# Patient Record
Sex: Female | Born: 1960 | Race: White | Hispanic: No | Marital: Married | Smoking: Never smoker
Health system: Southern US, Community
[De-identification: ages and names within clinical notes are randomized; demographics above are authoritative.]

## PROBLEM LIST (undated history)

## (undated) DIAGNOSIS — N879 Dysplasia of cervix uteri, unspecified: Secondary | ICD-10-CM

## (undated) DIAGNOSIS — I10 Essential (primary) hypertension: Secondary | ICD-10-CM

## (undated) DIAGNOSIS — E7800 Pure hypercholesterolemia, unspecified: Secondary | ICD-10-CM

## (undated) DIAGNOSIS — F419 Anxiety disorder, unspecified: Secondary | ICD-10-CM

## (undated) DIAGNOSIS — K219 Gastro-esophageal reflux disease without esophagitis: Secondary | ICD-10-CM

## (undated) DIAGNOSIS — Z9189 Other specified personal risk factors, not elsewhere classified: Secondary | ICD-10-CM

## (undated) HISTORY — DX: Essential (primary) hypertension: I10

## (undated) HISTORY — DX: Anxiety disorder, unspecified: F41.9

## (undated) HISTORY — DX: Pure hypercholesterolemia, unspecified: E78.00

## (undated) HISTORY — PX: CARPAL TUNNEL RELEASE: SHX101

## (undated) HISTORY — DX: Dysplasia of cervix uteri, unspecified: N87.9

## (undated) HISTORY — PX: FOOT SURGERY: SHX648

## (undated) HISTORY — DX: Other specified personal risk factors, not elsewhere classified: Z91.89

## (undated) HISTORY — DX: Gastro-esophageal reflux disease without esophagitis: K21.9

---

## 1991-12-06 DIAGNOSIS — N879 Dysplasia of cervix uteri, unspecified: Secondary | ICD-10-CM

## 1991-12-06 HISTORY — DX: Dysplasia of cervix uteri, unspecified: N87.9

## 2001-01-01 ENCOUNTER — Other Ambulatory Visit: Admission: RE | Admit: 2001-01-01 | Discharge: 2001-01-01 | Payer: Self-pay | Admitting: Gynecology

## 2002-01-21 ENCOUNTER — Other Ambulatory Visit: Admission: RE | Admit: 2002-01-21 | Discharge: 2002-01-21 | Payer: Self-pay | Admitting: Gynecology

## 2003-01-23 ENCOUNTER — Other Ambulatory Visit: Admission: RE | Admit: 2003-01-23 | Discharge: 2003-01-23 | Payer: Self-pay | Admitting: Gynecology

## 2004-02-09 ENCOUNTER — Other Ambulatory Visit: Admission: RE | Admit: 2004-02-09 | Discharge: 2004-02-09 | Payer: Self-pay | Admitting: Gynecology

## 2004-02-12 ENCOUNTER — Encounter: Admission: RE | Admit: 2004-02-12 | Discharge: 2004-02-12 | Payer: Self-pay | Admitting: Gynecology

## 2005-02-10 ENCOUNTER — Other Ambulatory Visit: Admission: RE | Admit: 2005-02-10 | Discharge: 2005-02-10 | Payer: Self-pay | Admitting: Gynecology

## 2006-03-31 ENCOUNTER — Other Ambulatory Visit: Admission: RE | Admit: 2006-03-31 | Discharge: 2006-03-31 | Payer: Self-pay | Admitting: Gynecology

## 2006-04-28 ENCOUNTER — Encounter: Admission: RE | Admit: 2006-04-28 | Discharge: 2006-04-28 | Payer: Self-pay | Admitting: Gynecology

## 2006-11-24 ENCOUNTER — Ambulatory Visit (HOSPITAL_COMMUNITY): Admission: RE | Admit: 2006-11-24 | Discharge: 2006-11-24 | Payer: Self-pay | Admitting: Family Medicine

## 2007-04-06 ENCOUNTER — Other Ambulatory Visit: Admission: RE | Admit: 2007-04-06 | Discharge: 2007-04-06 | Payer: Self-pay | Admitting: Gynecology

## 2008-05-15 ENCOUNTER — Other Ambulatory Visit: Admission: RE | Admit: 2008-05-15 | Discharge: 2008-05-15 | Payer: Self-pay | Admitting: Gynecology

## 2009-04-17 ENCOUNTER — Encounter: Admission: RE | Admit: 2009-04-17 | Discharge: 2009-04-17 | Payer: Self-pay | Admitting: Gynecology

## 2009-07-22 ENCOUNTER — Ambulatory Visit: Payer: Self-pay | Admitting: Gynecology

## 2009-07-22 ENCOUNTER — Encounter: Payer: Self-pay | Admitting: Gynecology

## 2009-07-22 ENCOUNTER — Other Ambulatory Visit: Admission: RE | Admit: 2009-07-22 | Discharge: 2009-07-22 | Payer: Self-pay | Admitting: Gynecology

## 2009-10-12 ENCOUNTER — Encounter: Admission: RE | Admit: 2009-10-12 | Discharge: 2009-10-12 | Payer: Self-pay | Admitting: Gynecology

## 2010-08-20 ENCOUNTER — Ambulatory Visit: Payer: Self-pay | Admitting: Gynecology

## 2010-08-20 ENCOUNTER — Other Ambulatory Visit: Admission: RE | Admit: 2010-08-20 | Discharge: 2010-08-20 | Payer: Self-pay | Admitting: Gynecology

## 2010-08-21 IMAGING — MG MM DIAGNOSTIC UNILATERAL R
3 series · 3 of 3 positions shown · non-contrast
Comparison: 04/03/2009 mammograms from the [REDACTED],
[facility] and 04/28/2006 mammograms from the [REDACTED].

CLINICAL DATA: Abnormal screening mammogram with right breast
calcifications.

DIGITAL DIAGNOSTIC RIGHT MAMMOGRAM

[R CC]
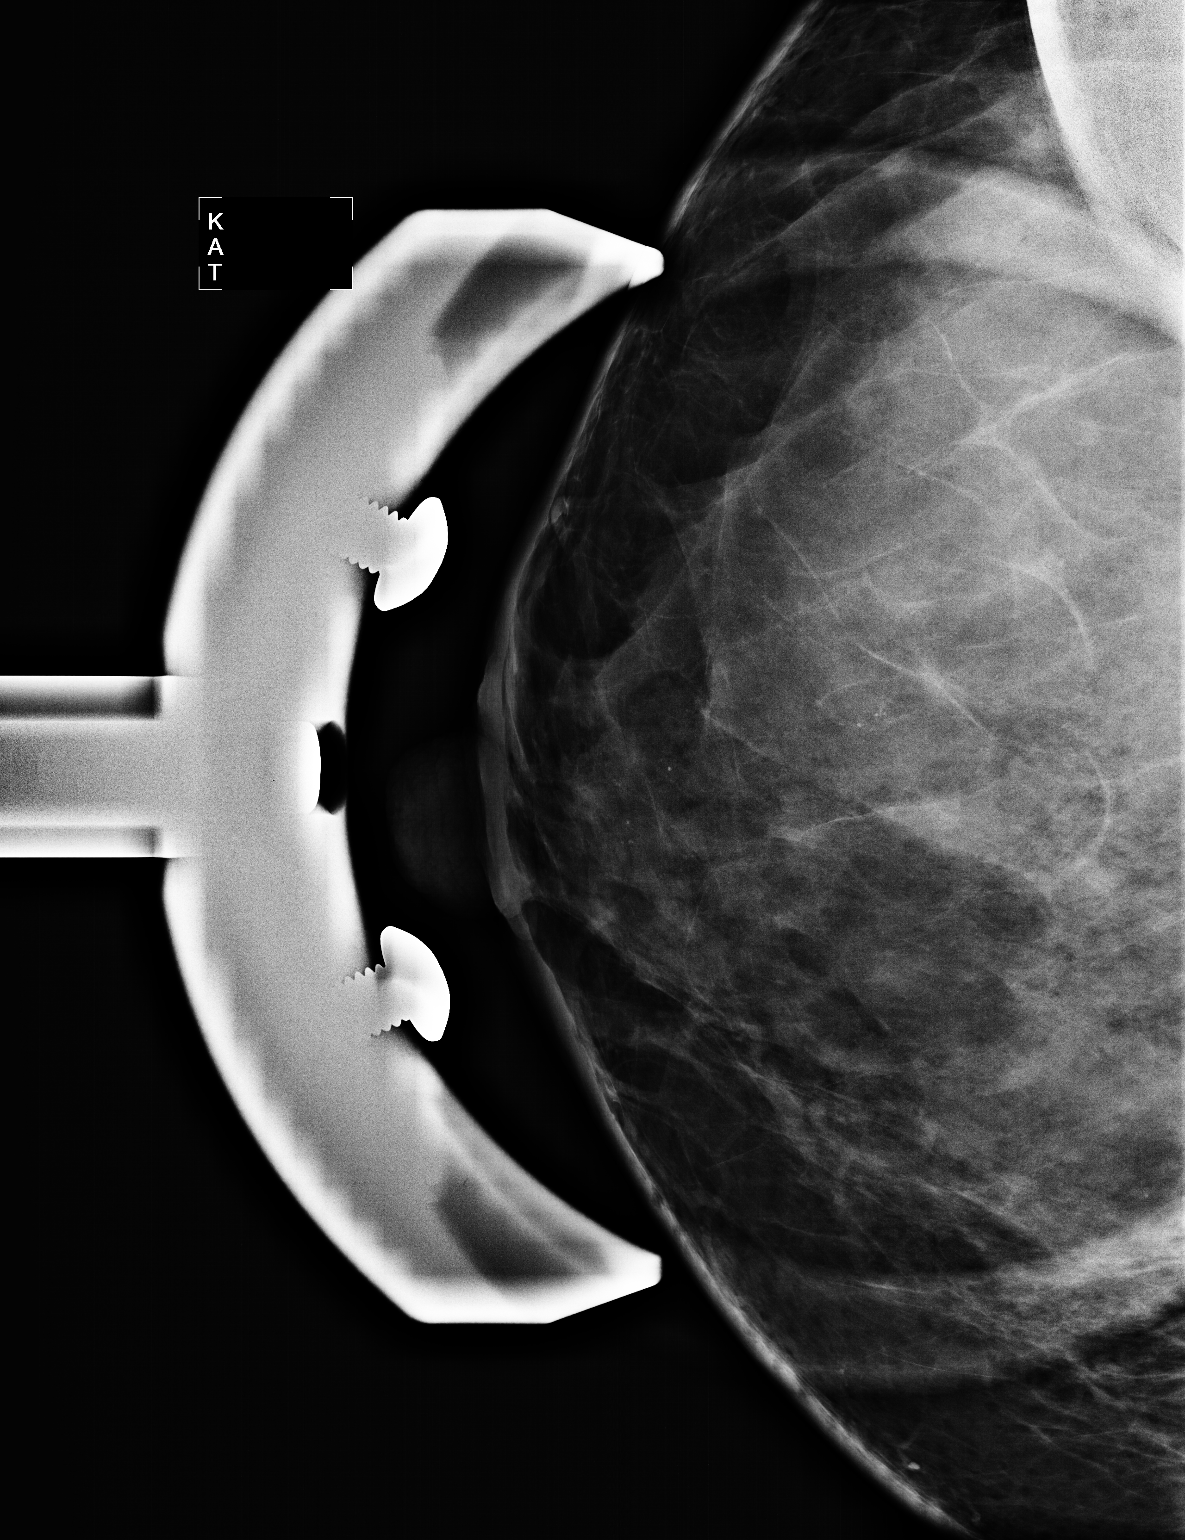

[R ML (1 of 2)]
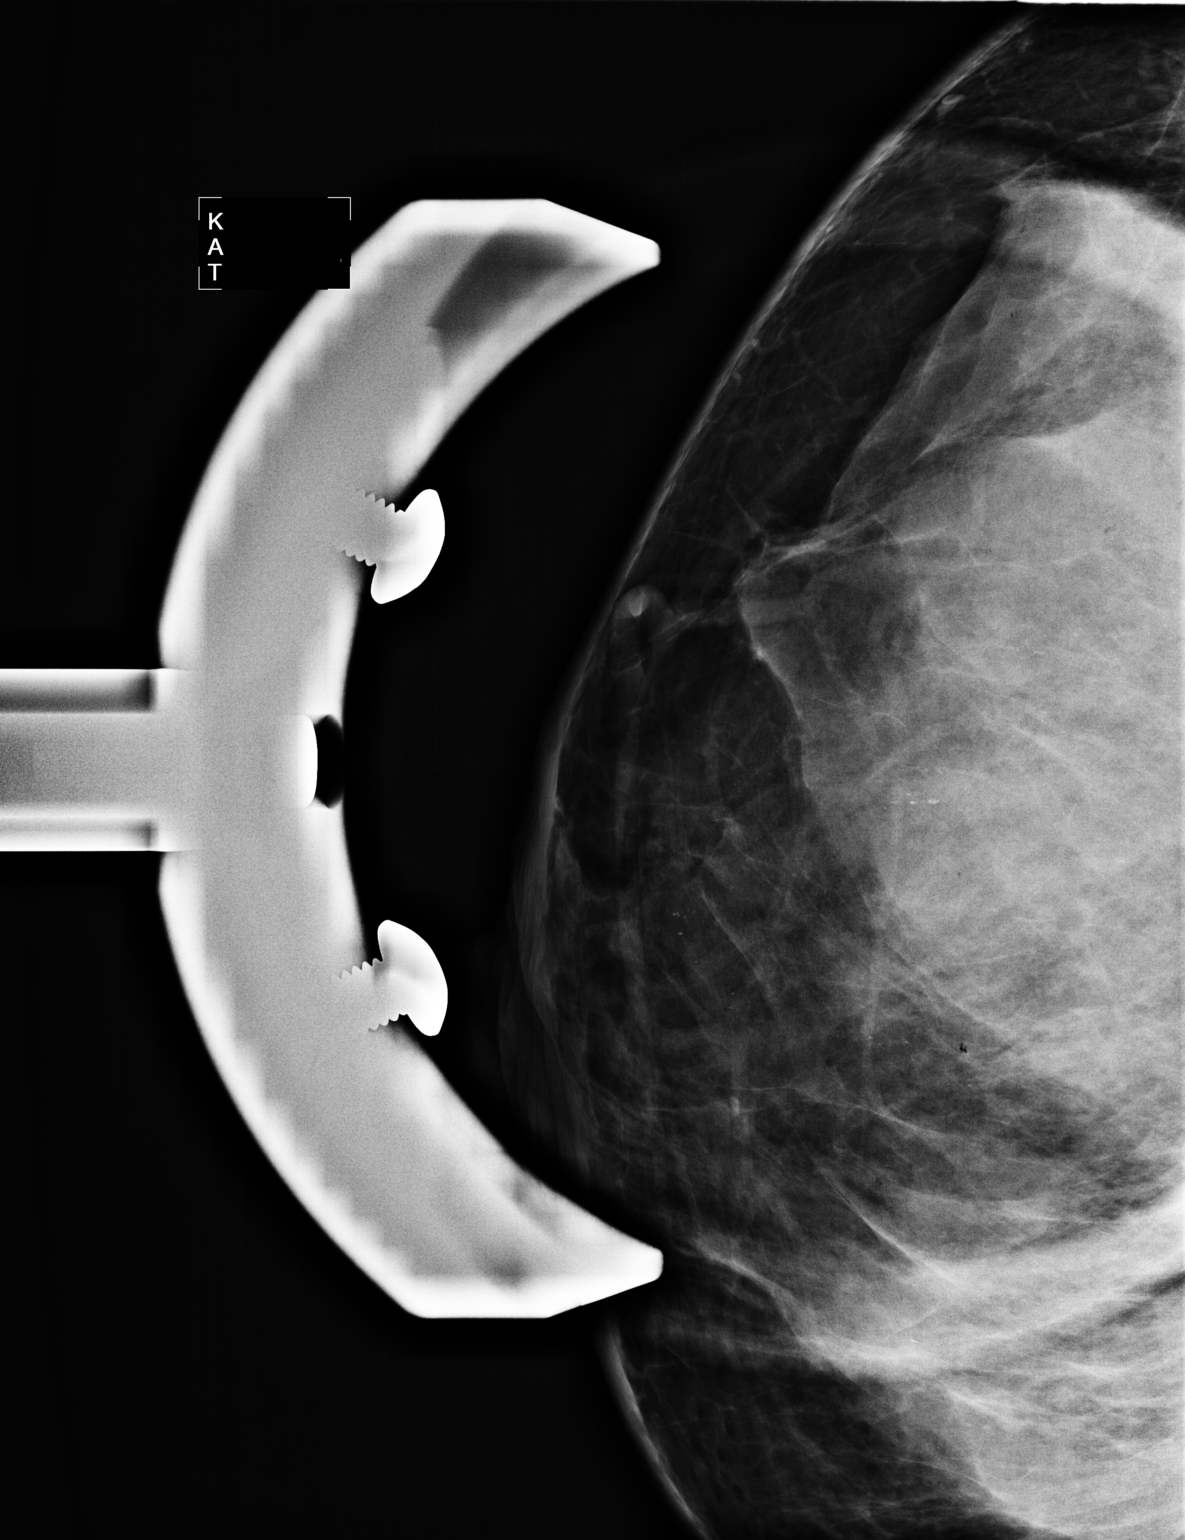

[R ML (2 of 2)]
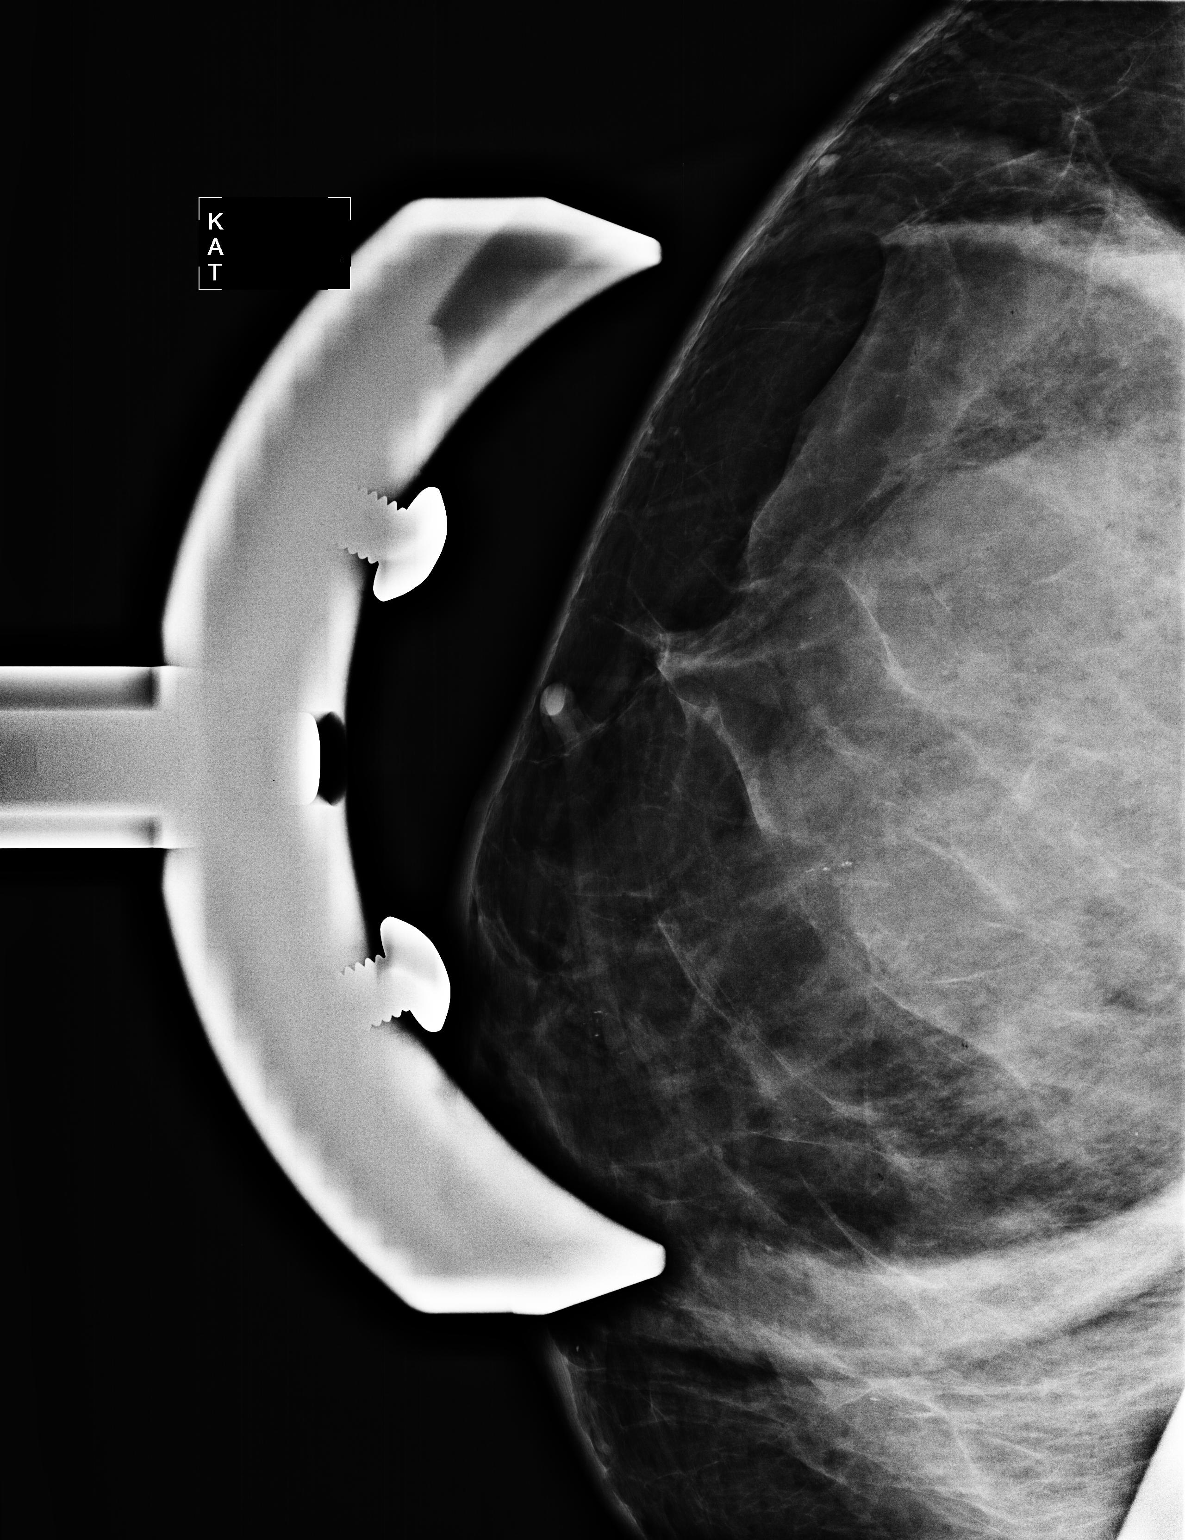

[3 of 3 positions shown; findings below may reference images not displayed]

FINDINGS: CC and MLO magnification views of the anterior and
central right breast demonstrate a few punctate and slightly coarse
calcifications within the subareolar region and anterior central
right breast.  These calcifications were present in 9333 and have
not significantly changed.

There is no evidence of associated mass or distortion.
IMPRESSION: Likely benign right breast calcifications.  Right diagnostic
mammogram in 6 months is recommended to ensure stability.

These findings were discussed with the patient.  She was encouraged
to continue monthly self exams and to contact her primary physician
if any changes noted.

BI-RADS CATEGORY 3:  Probably benign finding(s) - short interval
follow-up suggested.

Recommend right diagnostic mammogram with magnification views in 6
months to ensure stability of these right breast calcifications.

## 2010-10-05 ENCOUNTER — Encounter: Admission: RE | Admit: 2010-10-05 | Discharge: 2010-10-05 | Payer: Self-pay | Admitting: Gynecology

## 2010-12-26 ENCOUNTER — Encounter: Payer: Self-pay | Admitting: Gynecology

## 2011-09-27 ENCOUNTER — Other Ambulatory Visit: Payer: Self-pay | Admitting: Gynecology

## 2011-09-27 ENCOUNTER — Other Ambulatory Visit: Payer: Self-pay | Admitting: *Deleted

## 2011-09-27 DIAGNOSIS — R921 Mammographic calcification found on diagnostic imaging of breast: Secondary | ICD-10-CM

## 2011-11-18 ENCOUNTER — Other Ambulatory Visit: Payer: Self-pay | Admitting: Gynecology

## 2011-11-18 ENCOUNTER — Ambulatory Visit
Admission: RE | Admit: 2011-11-18 | Discharge: 2011-11-18 | Disposition: A | Discharge status: Home / Self Care | Payer: 59 | Source: Ambulatory Visit | Attending: Gynecology | Admitting: Gynecology

## 2011-11-18 DIAGNOSIS — R921 Mammographic calcification found on diagnostic imaging of breast: Secondary | ICD-10-CM

## 2011-11-21 DIAGNOSIS — E7800 Pure hypercholesterolemia, unspecified: Secondary | ICD-10-CM | POA: Insufficient documentation

## 2011-11-21 DIAGNOSIS — IMO0001 Reserved for inherently not codable concepts without codable children: Secondary | ICD-10-CM | POA: Insufficient documentation

## 2011-11-30 ENCOUNTER — Ambulatory Visit (INDEPENDENT_AMBULATORY_CARE_PROVIDER_SITE_OTHER): Payer: 59 | Admitting: Gynecology

## 2011-11-30 ENCOUNTER — Encounter: Payer: Self-pay | Admitting: Gynecology

## 2011-11-30 VITALS — BP 118/70 | Ht 64.0 in | Wt 164.0 lb

## 2011-11-30 DIAGNOSIS — K219 Gastro-esophageal reflux disease without esophagitis: Secondary | ICD-10-CM | POA: Insufficient documentation

## 2011-11-30 DIAGNOSIS — Z30431 Encounter for routine checking of intrauterine contraceptive device: Secondary | ICD-10-CM

## 2011-11-30 DIAGNOSIS — Z01419 Encounter for gynecological examination (general) (routine) without abnormal findings: Secondary | ICD-10-CM

## 2011-11-30 NOTE — Progress Notes (Signed)
Danielle Estrada [birth year] [id number]        50 y.o.  for annual exam.  Recent mammogram showed calcifications and she has a needle biopsy schedule for this coming week. Having light spotting menses each month. Not having hot flashes sweats or other symptoms.  Past medical history,surgical history, medications, allergies, family history and social history were all reviewed and documented in the EPIC chart. ROS:  Was performed and pertinent positives and negatives are included in the history.  Exam: chaperone present Filed Vitals:   11/30/11 1101  BP: 118/70   General appearance  Normal Skin grossly normal Head/Neck normal with no cervical or supraclavicular adenopathy thyroid normal Lungs  clear Cardiac RR, without RMG Abdominal  soft, nontender, without masses, organomegaly or hernia Breasts  examined lying and sitting without masses, retractions, discharge or axillary adenopathy. Pelvic  Ext/BUS/vagina  normal   Cervix  normal  IUD string not visualized or palpated on bimanual  Uterus  retroverted, normal size, shape and contour, midline and mobile nontender   Adnexa  Without masses or tenderness    Anus and perineum  normal   Rectovaginal  normal sphincter tone without palpated masses or tenderness.    Assessment/Plan:  50 y.o. female for annual exam.    1. Breast health. Patient has a biopsy scheduled for next week. I reviewed with her that if it is carcinoma especially if receptor positive but regardless that I would recommend having her Mirena IUD removed given that it is progesterone-related. Patient will follow up with me after biopsy and if indeed carcinoma then we'll plan on removing her IUD.  SBE monthly reviewed. 2. IUD management. She has another one and a half years left on her IUD. When it comes time to have it removed assuming #1 not cancer then we'll plan FSH first before considering other birth control or placement of the IUD. 3. Pap smear. Last Pap 2011 which was  normal. She has numerous normal records in her chart and no history of significant abnormalities in the past. No Was done today and we'll plan on every three-year interval she agrees with this.  4. Health maintenance.  No blood work was done today as is all done through her primary who follows her for health maintenance and she just recently had it all done.    Dara Lords MD, 11:44 AM 11/30/2011

## 2011-11-30 NOTE — Patient Instructions (Signed)
Follow up for breast biopsy as scheduled. If positive for carcinoma then we'll need to have IUD removed and schedule an appointment for this.

## 2011-12-05 ENCOUNTER — Ambulatory Visit
Admission: RE | Admit: 2011-12-05 | Discharge: 2011-12-05 | Disposition: A | Discharge status: Home / Self Care | Payer: 59 | Source: Ambulatory Visit | Attending: Gynecology | Admitting: Gynecology

## 2011-12-05 DIAGNOSIS — R921 Mammographic calcification found on diagnostic imaging of breast: Secondary | ICD-10-CM

## 2011-12-05 HISTORY — PX: BREAST BIOPSY: SHX20

## 2012-12-10 ENCOUNTER — Other Ambulatory Visit: Payer: Self-pay | Admitting: Gynecology

## 2012-12-10 DIAGNOSIS — Z1231 Encounter for screening mammogram for malignant neoplasm of breast: Secondary | ICD-10-CM

## 2012-12-31 ENCOUNTER — Ambulatory Visit
Admission: RE | Admit: 2012-12-31 | Discharge: 2012-12-31 | Disposition: A | Discharge status: Home / Self Care | Payer: 59 | Source: Ambulatory Visit | Attending: Gynecology | Admitting: Gynecology

## 2012-12-31 DIAGNOSIS — Z1231 Encounter for screening mammogram for malignant neoplasm of breast: Secondary | ICD-10-CM

## 2013-01-15 ENCOUNTER — Ambulatory Visit (INDEPENDENT_AMBULATORY_CARE_PROVIDER_SITE_OTHER): Payer: 59 | Admitting: Gynecology

## 2013-01-15 ENCOUNTER — Encounter: Payer: Self-pay | Admitting: Gynecology

## 2013-01-15 ENCOUNTER — Other Ambulatory Visit (HOSPITAL_COMMUNITY)
Admission: RE | Admit: 2013-01-15 | Discharge: 2013-01-15 | Disposition: A | Discharge status: Home / Self Care | Payer: 59 | Source: Ambulatory Visit | Attending: Gynecology | Admitting: Gynecology

## 2013-01-15 VITALS — BP 130/80 | Ht 64.0 in | Wt 162.0 lb

## 2013-01-15 DIAGNOSIS — Z30431 Encounter for routine checking of intrauterine contraceptive device: Secondary | ICD-10-CM

## 2013-01-15 DIAGNOSIS — Z01419 Encounter for gynecological examination (general) (routine) without abnormal findings: Secondary | ICD-10-CM | POA: Insufficient documentation

## 2013-01-15 DIAGNOSIS — Z1151 Encounter for screening for human papillomavirus (HPV): Secondary | ICD-10-CM | POA: Insufficient documentation

## 2013-01-15 DIAGNOSIS — N879 Dysplasia of cervix uteri, unspecified: Secondary | ICD-10-CM | POA: Insufficient documentation

## 2013-01-15 NOTE — Patient Instructions (Signed)
Follow up for IUD replacement before May 2014. Otherwise follow up in one year for annual gynecologic exam.

## 2013-01-15 NOTE — Progress Notes (Signed)
Danielle Estrada August 12, 1961 [id number]        52 y.o.  [id number] for annual exam.  Several issues noted below.  Past medical history,surgical history, medications, allergies, family history and social history were all reviewed and documented in the EPIC chart. ROS:  Was performed and pertinent positives and negatives are included in the history.  Exam: [name] assistant Filed Vitals:   01/15/13 1605  BP: 130/80  Height: 5\' 4"  (1.626 m)  Weight: 162 lb (73.483 kg)   General appearance  Normal Skin grossly normal Head/Neck normal with no cervical or supraclavicular adenopathy thyroid normal Lungs  clear Cardiac RR, without RMG Abdominal  soft, nontender, without masses, organomegaly or hernia Breasts  examined lying and sitting without masses, retractions, discharge or axillary adenopathy. Pelvic  Ext/BUS/vagina  normal   Cervix  Normal. IUD string palpated at os  Uterus  anteverted, normal size, shape and contour, midline and mobile nontender   Adnexa  Without masses or tenderness    Anus and perineum  normal   Rectovaginal  normal sphincter tone without palpated masses or tenderness.    Assessment/Plan:  52 y.o. [id number] female for annual exam.   1. Mirena IUD 04/2008. Due to be replaced this coming May. Will check FSH now as she is age 51.  Tentatively she knows to come back before May to have her IUD replaced. The options to remove it and leave it out given her age particularly if [facility] is rising discussed but I think replacing it would be good idea as she is having good menstrual suppression with it and this will clearly get her through the menopause if we replace it now.  The issues of contraception if we remove it now and not replace it was also discussed. 2. Mammography 12/2012 normal. Continue with annual mammography. SBE monthly reviewed. 3. Pap smear 2011. Pap/HPV today. Patient does have history of cervical dysplasia in 1993 and had some treatment but she does not remember what. Her  Pap smears have all been normal since then. 4. Colonoscopy. Recommended scheduling screening colonoscopy as she turns 51 she agrees to do so. 5. Health maintenance. No blood work done today as it was all done through her primary physician's office who she recently saw. She does have a mildly elevated glucose of 104 and an LDL of 124. She is initiating a weight loss program which will help with this. Follow up for IUD replacement and FSH value. Otherwise follow up in one year.    Dara Lords MD, 4:44 PM 01/15/2013

## 2013-01-16 ENCOUNTER — Encounter: Payer: Self-pay | Admitting: Gynecology

## 2013-01-16 LAB — FOLLICLE STIMULATING HORMONE: FSH: 48.4 m[IU]/mL

## 2013-01-29 ENCOUNTER — Other Ambulatory Visit: Payer: Self-pay | Admitting: Gynecology

## 2013-01-29 ENCOUNTER — Telehealth: Payer: Self-pay | Admitting: Gynecology

## 2013-01-29 MED ORDER — LEVONORGESTREL 20 MCG/24HR IU IUD: INTRAUTERINE_SYSTEM | Freq: Once | INTRAUTERINE | Status: DC

## 2013-01-29 NOTE — Telephone Encounter (Signed)
Pt was advised today that the removal of existing Mirena and the insertion of a new one is covered by her [employer] ins at 100%, no copay.  Appt already made for 04/02/13.WL

## 2013-03-05 HISTORY — PX: INTRAUTERINE DEVICE INSERTION: SHX323

## 2013-04-02 ENCOUNTER — Encounter: Payer: Self-pay | Admitting: Gynecology

## 2013-04-02 ENCOUNTER — Ambulatory Visit (INDEPENDENT_AMBULATORY_CARE_PROVIDER_SITE_OTHER): Payer: 59 | Admitting: Gynecology

## 2013-04-02 DIAGNOSIS — Z30431 Encounter for routine checking of intrauterine contraceptive device: Secondary | ICD-10-CM

## 2013-04-02 DIAGNOSIS — Z30432 Encounter for removal of intrauterine contraceptive device: Secondary | ICD-10-CM

## 2013-04-02 MED ORDER — LEVONORGESTREL 20 MCG/24HR IU IUD: INTRAUTERINE_SYSTEM | Freq: Once | INTRAUTERINE | Status: DC

## 2013-04-02 NOTE — Patient Instructions (Signed)
Intrauterine Device Insertion Most often, an intrauterine device (IUD) is inserted into the uterus to prevent pregnancy. There are 2 types of IUDs available:  Copper IUD. This type of IUD creates an environment that is not favorable to sperm survival. The mechanism of action of the copper IUD is not known for certain. It can stay in place for 10 years.  Hormone IUD. This type of IUD contains the hormone progestin (synthetic progesterone). The progestin thickens the cervical mucus and prevents sperm from entering the uterus, and it also thins the uterine lining. There is no evidence that the hormone IUD prevents implantation. The hormone IUD can stay in place for up to 5 years. An IUD is the most cost-effective birth control if left in place for the full duration. It may be removed at any time. LET YOUR CAREGIVER KNOW ABOUT:  Sensitivity to metals.  Medicines taken including herbs, eyedrops, over-the-counter medicines, and creams.  Use of steroids (by mouth or creams).  Previous problems with anesthetics or numbing medicine.  Previous gynecological surgery.  History of blood clots or clotting disorders.  Possibility of pregnancy.  Menstrual irregularities.  Concerns regarding unusual vaginal discharge or odors.  Previous experience with an IUD.  Other health problems. RISKS AND COMPLICATIONS  Accidental puncture (perforation) of the uterus.  Accidental placement of the IUD either in the muscle layer of the uterus (myometrium) or outside the uterus. If this happen, the IUD can be found essentially floating around the bowels. When this happens, the IUD must be taken out surgically.  The IUD may fall out of the uterus (expulsion). This is more common in women who have recently had a child.   Pregnancy in the fallopian tube (ectopic). BEFORE THE PROCEDURE  Schedule the IUD insertion for when you will have your menstrual period or right after, to make sure you are not pregnant.  Placement of the IUD is better tolerated shortly after a menstrual cycle.  You may need to take tests or be examined to make sure you are not pregnant.  You may be required to take a pregnancy test.  You may be required to get checked for sexually transmitted infections (STIs) prior to placement. Placing an IUD in someone who has an infection can make an infection worse.  You may be given a pain reliever to take 1 or 2 hours before the procedure.  An exam will be performed to determine the size and position of your uterus.  Ask your caregiver about changing or stopping your regular medicines. PROCEDURE   A tool (speculum) is placed in the vagina. This allows your caregiver to see the lower part of the uterus (cervix).  The cervix is prepped with a medicine that lowers the risk of infection.  You may be given a medicine to numb each side of the cervix (intracervical or paracervical block). This is used to block and control any discomfort with insertion.  A tool (uterine sound) is inserted into the uterus to determine the length of the uterine cavity and the direction the uterus may be tilted.  A slim instrument (IUD inserter) is inserted through the cervical canal and into your uterus.  The IUD is placed in the uterine cavity and the insertion device is removed.  The nylon string that is attached to the IUD, and used for eventual IUD removal, is trimmed. It is trimmed so that it lays high in the vagina, just outside the cervix. AFTER THE PROCEDURE  You may have bleeding after the  attached to the IUD, and used for eventual IUD removal, is trimmed. It is trimmed so that it lays high in the vagina, just outside the cervix.  AFTER THE PROCEDURE  · You may have bleeding after the procedure. This is normal. It varies from light spotting for a few days to menstrual-like bleeding.   · You may have mild cramping.  · Practice checking the string coming out of the cervix to make sure the IUD remains in the uterus. If you cannot feel the string, you should schedule a "string check" with your caregiver.  · If you had a hormone IUD inserted, expect that your period may be lighter or nonexistent  within a year's time (though this is not always the case). There may be delayed fertility with the hormone IUD as a result of its progesterone effect. When you are ready to become pregnant, it is suggested to have the IUD removed up to 1 year in advance.  · Yearly exams are advised.  Document Released: 07/20/2011 Document Revised: 02/13/2012 Document Reviewed: 07/20/2011  ExitCare® Patient Information ©2013 [employer].

## 2013-04-02 NOTE — Progress Notes (Signed)
Patient presents for removal and replacement of her Mirena IUD. She has read through the booklet, has no contraindications and signed the consent form. Her FSH did return 48. She has not had a menses since I saw her in February. Not having significant hot flashes and night sweats. Options to remove her Mirena IUD just monitor her cycles versus replacing it discussed and she was to take the risks as far as pregnancy wants it replaced.  I reviewed the insertional process with her as well as the risks to include infection either immediate or long-term, uterine perforation or migration requiring surgery to remove, other complications such as pain, hormonal side effects and possibilities of failure with subsequent pregnancy.    Exam with [name] assistant Pelvic: External BUS vagina normal. Cervix normal with IUD string visible. Uterus retroverted normal size shape contour midline mobile nontender. Adnexa without masses or tenderness.  Procedure: The cervix was cleansed with Betadine and the IUD string was grasped with a [place] forcep and her Mirena IUD was removed, shown to her and discarded. The anterior lip grasped with a single-tooth tenaculum, the uterus was sounded and a new Mirena IUD was placed according to manufacturer's recommendations without difficulty. The strings were trimmed. The patient tolerated well and will follow up in one month for a postinsertional check.  Lot number: [id number]

## 2013-05-03 ENCOUNTER — Ambulatory Visit (INDEPENDENT_AMBULATORY_CARE_PROVIDER_SITE_OTHER): Payer: 59 | Admitting: Gynecology

## 2013-05-03 ENCOUNTER — Encounter: Payer: Self-pay | Admitting: Gynecology

## 2013-05-03 DIAGNOSIS — Z30431 Encounter for routine checking of intrauterine contraceptive device: Secondary | ICD-10-CM

## 2013-05-03 NOTE — Patient Instructions (Signed)
Intrauterine Device Insertion Most often, an intrauterine device (IUD) is inserted into the uterus to prevent pregnancy. There are 2 types of IUDs available:  Copper IUD. This type of IUD creates an environment that is not favorable to sperm survival. The mechanism of action of the copper IUD is not known for certain. It can stay in place for 10 years.  Hormone IUD. This type of IUD contains the hormone progestin (synthetic progesterone). The progestin thickens the cervical mucus and prevents sperm from entering the uterus, and it also thins the uterine lining. There is no evidence that the hormone IUD prevents implantation. The hormone IUD can stay in place for up to 5 years. An IUD is the most cost-effective birth control if left in place for the full duration. It may be removed at any time. LET YOUR CAREGIVER KNOW ABOUT:  Sensitivity to metals.  Medicines taken including herbs, eyedrops, over-the-counter medicines, and creams.  Use of steroids (by mouth or creams).  Previous problems with anesthetics or numbing medicine.  Previous gynecological surgery.  History of blood clots or clotting disorders.  Possibility of pregnancy.  Menstrual irregularities.  Concerns regarding unusual vaginal discharge or odors.  Previous experience with an IUD.  Other health problems. RISKS AND COMPLICATIONS  Accidental puncture (perforation) of the uterus.  Accidental placement of the IUD either in the muscle layer of the uterus (myometrium) or outside the uterus. If this happen, the IUD can be found essentially floating around the bowels. When this happens, the IUD must be taken out surgically.  The IUD may fall out of the uterus (expulsion). This is more common in women who have recently had a child.   Pregnancy in the fallopian tube (ectopic). BEFORE THE PROCEDURE  Schedule the IUD insertion for when you will have your menstrual period or right after, to make sure you are not pregnant.  Placement of the IUD is better tolerated shortly after a menstrual cycle.  You may need to take tests or be examined to make sure you are not pregnant.  You may be required to take a pregnancy test.  You may be required to get checked for sexually transmitted infections (STIs) prior to placement. Placing an IUD in someone who has an infection can make an infection worse.  You may be given a pain reliever to take 1 or 2 hours before the procedure.  An exam will be performed to determine the size and position of your uterus.  Ask your caregiver about changing or stopping your regular medicines. PROCEDURE   A tool (speculum) is placed in the vagina. This allows your caregiver to see the lower part of the uterus (cervix).  The cervix is prepped with a medicine that lowers the risk of infection.  You may be given a medicine to numb each side of the cervix (intracervical or paracervical block). This is used to block and control any discomfort with insertion.  A tool (uterine sound) is inserted into the uterus to determine the length of the uterine cavity and the direction the uterus may be tilted.  A slim instrument (IUD inserter) is inserted through the cervical canal and into your uterus.  The IUD is placed in the uterine cavity and the insertion device is removed.  The nylon string that is attached to the IUD, and used for eventual IUD removal, is trimmed. It is trimmed so that it lays high in the vagina, just outside the cervix. AFTER THE PROCEDURE  You may have bleeding after the   procedure. This is normal. It varies from light spotting for a few days to menstrual-like bleeding.  You may have mild cramping.  Practice checking the string coming out of the cervix to make sure the IUD remains in the uterus. If you cannot feel the string, you should schedule a "string check" with your caregiver.  If you had a hormone IUD inserted, expect that your period may be lighter or nonexistent  within a year's time (though this is not always the case). There may be delayed fertility with the hormone IUD as a result of its progesterone effect. When you are ready to become pregnant, it is suggested to have the IUD removed up to 1 year in advance.  Yearly exams are advised. Document Released: 07/20/2011 Document Revised: 02/13/2012 Document Reviewed: 07/20/2011 ExitCare Patient Information 2014 [employer].  

## 2013-05-03 NOTE — Progress Notes (Signed)
Patient presents in followup for Mirena IUD postinsertional check. Doing well without complaints.  Exam with Sherrilyn Rist Asst. Abdomen soft nontender without masses guarding rebound organomegaly. Pelvic external BUS vagina normal. Cervix normal with IUD string visualized and appropriate length. Uterus retroverted grossly normal in size and mobile nontender. Adnexa without masses or tenderness.  Assessment and plan: One-month followup visit IUD placement doing well. Followup when she is due for her annual exam, sooner as needed.

## 2013-12-17 ENCOUNTER — Other Ambulatory Visit: Payer: Self-pay

## 2013-12-17 DIAGNOSIS — Z1231 Encounter for screening mammogram for malignant neoplasm of breast: Secondary | ICD-10-CM

## 2014-02-05 ENCOUNTER — Ambulatory Visit: Payer: 59

## 2014-03-26 ENCOUNTER — Ambulatory Visit (INDEPENDENT_AMBULATORY_CARE_PROVIDER_SITE_OTHER): Payer: 59 | Admitting: Gynecology

## 2014-03-26 ENCOUNTER — Encounter: Payer: Self-pay | Admitting: Gynecology

## 2014-03-26 VITALS — BP 120/70 | Ht 65.0 in | Wt 168.0 lb

## 2014-03-26 DIAGNOSIS — Z30431 Encounter for routine checking of intrauterine contraceptive device: Secondary | ICD-10-CM

## 2014-03-26 DIAGNOSIS — Z01419 Encounter for gynecological examination (general) (routine) without abnormal findings: Secondary | ICD-10-CM

## 2014-03-26 DIAGNOSIS — N951 Menopausal and female climacteric states: Secondary | ICD-10-CM

## 2014-03-26 NOTE — Patient Instructions (Signed)
Followup for your mammogram as scheduled. Schedule colonoscopy with [place] [facility] at [phone] or [facility] gastroenterology at [phone] Followup in one year for annual exam.  Health Maintenance, Female A healthy lifestyle and preventative care can promote health and wellness.  Maintain regular health, dental, and eye exams.  Eat a healthy diet. Foods like vegetables, fruits, whole grains, low-fat dairy products, and lean protein foods contain the nutrients you need without too many calories. Decrease your intake of foods high in solid fats, added sugars, and salt. Get information about a proper diet from your caregiver, if necessary.  Regular physical exercise is one of the most important things you can do for your health. Most adults should get at least 150 minutes of moderate-intensity exercise (any activity that increases your heart rate and causes you to sweat) each week. In addition, most adults need muscle-strengthening exercises on 2 or more days a week.   Maintain a healthy weight. The body mass index (BMI) is a screening tool to identify possible weight problems. It provides an estimate of body fat based on height and weight. Your caregiver can help determine your BMI, and can help you achieve or maintain a healthy weight. For adults 20 years and older:  A BMI below 18.5 is considered underweight.  A BMI of 18.5 to 24.9 is normal.  A BMI of 25 to 29.9 is considered overweight.  A BMI of 30 and above is considered obese.  Maintain normal blood lipids and cholesterol by exercising and minimizing your intake of saturated fat. Eat a balanced diet with plenty of fruits and vegetables. Blood tests for lipids and cholesterol should begin at age 48 and be repeated every 5 years. If your lipid or cholesterol levels are high, you are over 50, or you are a high risk for heart disease, you may need your cholesterol levels checked more frequently.Ongoing high lipid and cholesterol  levels should be treated with medicines if diet and exercise are not effective.  If you smoke, find out from your caregiver how to quit. If you do not use tobacco, do not start.  Lung cancer screening is recommended for adults aged 41 80 years who are at high risk for developing lung cancer because of a history of smoking. Yearly low-dose computed tomography (CT) is recommended for people who have at least a 30-pack-year history of smoking and are a current smoker or have quit within the past 15 years. A pack year of smoking is smoking an average of 1 pack of cigarettes a day for 1 year (for example: 1 pack a day for 30 years or 2 packs a day for 15 years). Yearly screening should continue until the smoker has stopped smoking for at least 15 years. Yearly screening should also be stopped for people who develop a health problem that would prevent them from having lung cancer treatment.  If you are pregnant, do not drink alcohol. If you are breastfeeding, be very cautious about drinking alcohol. If you are not pregnant and choose to drink alcohol, do not exceed 1 drink per day. One drink is considered to be 12 ounces (355 mL) of beer, 5 ounces (148 mL) of wine, or 1.5 ounces (44 mL) of liquor.  Avoid use of street drugs. Do not share needles with anyone. Ask for help if you need support or instructions about stopping the use of drugs.  High blood pressure causes heart disease and increases the risk of stroke. Blood pressure should be checked at least every 1  to 2 years. Ongoing high blood pressure should be treated with medicines, if weight loss and exercise are not effective.  If you are 55 to 52 years old, ask your caregiver if you should take aspirin to prevent strokes.  Diabetes screening involves taking a blood sample to check your fasting blood sugar level. This should be done once every 3 years, after age 59, if you are within normal weight and without risk factors for diabetes. Testing should be  considered at a younger age or be carried out more frequently if you are overweight and have at least 1 risk factor for diabetes.  Breast cancer screening is essential preventative care for women. You should practice "breast self-awareness." This means understanding the normal appearance and feel of your breasts and may include breast self-examination. Any changes detected, no matter how small, should be reported to a caregiver. Women in their 33s and 30s should have a clinical breast exam (CBE) by a caregiver as part of a regular health exam every 1 to 3 years. After age 76, women should have a CBE every year. Starting at age 14, women should consider having a mammogram (breast X-ray) every year. Women who have a family history of breast cancer should talk to their caregiver about genetic screening. Women at a high risk of breast cancer should talk to their caregiver about having an MRI and a mammogram every year.  Breast cancer gene (BRCA)-related cancer risk assessment is recommended for women who have family members with BRCA-related cancers. BRCA-related cancers include breast, ovarian, tubal, and peritoneal cancers. Having family members with these cancers may be associated with an increased risk for harmful changes (mutations) in the breast cancer genes BRCA1 and BRCA2. Results of the assessment will determine the need for genetic counseling and BRCA1 and BRCA2 testing.  The Pap test is a screening test for cervical cancer. Women should have a Pap test starting at age 2. Between ages 24 and 17, Pap tests should be repeated every 2 years. Beginning at age 19, you should have a Pap test every 3 years as long as the past 3 Pap tests have been normal. If you had a hysterectomy for a problem that was not cancer or a condition that could lead to cancer, then you no longer need Pap tests. If you are between ages 38 and 54, and you have had normal Pap tests going back 10 years, you no longer need Pap tests. If  you have had past treatment for cervical cancer or a condition that could lead to cancer, you need Pap tests and screening for cancer for at least 20 years after your treatment. If Pap tests have been discontinued, risk factors (such as a new sexual partner) need to be reassessed to determine if screening should be resumed. Some women have medical problems that increase the chance of getting cervical cancer. In these cases, your caregiver may recommend more frequent screening and Pap tests.  The human papillomavirus (HPV) test is an additional test that may be used for cervical cancer screening. The HPV test looks for the virus that can cause the cell changes on the cervix. The cells collected during the Pap test can be tested for HPV. The HPV test could be used to screen women aged 52 years and older, and should be used in women of any age who have unclear Pap test results. After the age of 14, women should have HPV testing at the same frequency as a Pap test.  Colorectal  cancer can be detected and often prevented. Most routine colorectal cancer screening begins at the age of 61 and continues through age 20. However, your caregiver may recommend screening at an earlier age if you have risk factors for colon cancer. On a yearly basis, your caregiver may provide home test kits to check for hidden blood in the stool. Use of a small camera at the end of a tube, to directly examine the colon (sigmoidoscopy or colonoscopy), can detect the earliest forms of colorectal cancer. Talk to your caregiver about this at age 27, when routine screening begins. Direct examination of the colon should be repeated every 5 to 10 years through age 64, unless early forms of pre-cancerous polyps or small growths are found.  Hepatitis C blood testing is recommended for all people born from [birth year] through 1964 and any individual with known risks for hepatitis C.  Practice safe sex. Use condoms and avoid high-risk sexual practices to  reduce the spread of sexually transmitted infections (STIs). Sexually active women aged 73 and younger should be checked for Chlamydia, which is a common sexually transmitted infection. Older women with new or multiple partners should also be tested for Chlamydia. Testing for other STIs is recommended if you are sexually active and at increased risk.  Osteoporosis is a disease in which the bones lose minerals and strength with aging. This can result in serious bone fractures. The risk of osteoporosis can be identified using a bone density scan. Women ages 29 and over and women at risk for fractures or osteoporosis should discuss screening with their caregivers. Ask your caregiver whether you should be taking a calcium supplement or vitamin D to reduce the rate of osteoporosis.  Menopause can be associated with physical symptoms and risks. Hormone replacement therapy is available to decrease symptoms and risks. You should talk to your caregiver about whether hormone replacement therapy is right for you.  Use sunscreen. Apply sunscreen liberally and repeatedly throughout the day. You should seek shade when your shadow is shorter than you. Protect yourself by wearing long sleeves, pants, a wide-brimmed hat, and sunglasses year round, whenever you are outdoors.  Notify your caregiver of new moles or changes in moles, especially if there is a change in shape or color. Also notify your caregiver if a mole is larger than the size of a pencil eraser.  Stay current with your immunizations. Document Released: 06/06/2011 Document Revised: 03/18/2013 Document Reviewed: 06/06/2011 [facility] Patient Information 2014 [place].

## 2014-03-26 NOTE — Progress Notes (Signed)
Danielle SchatzSandra L Estrada 11/18/1961 [id number]        53 y.o.  G2P2002 for annual exam.  Several issues noted below.  Past medical history,surgical history, problem list, medications, allergies, family history and social history were all reviewed and documented as reviewed in the EPIC chart.  ROS:  12 system ROS performed with pertinent positives and negatives included in the history, assessment and plan.  Included Systems: General, HEENT, Neck, Cardiovascular, Pulmonary, Gastrointestinal, Genitourinary, Musculoskeletal, Dermatologic, Endocrine, Hematological, Neurologic, Psychiatric Additional significant findings : None   Exam: [name] assistant Filed Vitals:   03/26/14 1549  BP: 120/70  Height: 5\' 5"  (1.651 m)  Weight: 168 lb (76.204 kg)   General appearance:  Normal affect, orientation and appearance. Skin: Grossly normal HEENT: Normal without gross oral lesions, cervical or supraclavicular adenopathy. Thyroid normal.  Lungs:  Clear without wheezing, rales or rhonchi Cardiac: RR, without RMG Abdominal:  Soft, nontender, without masses, guarding, rebound, organomegaly or hernia Breasts:  Examined lying and sitting without masses, retractions, discharge or axillary adenopathy. Pelvic:  Ext/BUS/vagina normal  Cervix normal with IUD string visualized  Uterus retroverted, normal size, shape and contour, midline and mobile nontender   Adnexa  Without masses or tenderness    Anus and perineum  Normal   Rectovaginal  Normal sphincter tone without palpated masses or tenderness.    Assessment/Plan:  53 y.o. 352P2002 female for annual exam without menses, Mirena IUD.   1. Mirena IUD 03/2013. Remains amenorrheic. IUD string visualized. Continue to monitor. 2. Menopausal symptoms. Patient is having some mild hot flushes and night sweats. FSH 48 last year. Discussed options to include HRT. Patient is not interested in HRT but prefers to monitor. Call if wants to rediscuss treatment options. 3. Pap smear  /HPV negative 2014. No Pap smear done today. No history of significant abnormal Pap smears. Repeat at 3-5 year interval for current screening guidelines. 4. Mammography 12/2012. Patient has scheduled next month. SBE monthly reviewed. 5. Colonoscopy never. Strongly recommended patient schedule screening colonoscopy. Benefits of precancerous polyp removal as well as early cancer detection discussed. Patient agrees to schedule. 6. Health maintenance. No blood work done as patient reports this all done through her primary physician's office. Followup one year, sooner as needed.   Note: This document was prepared with digital dictation and possible smart phrase technology. Any transcriptional errors that result from this process are unintentional.   Dara Lordsimothy P Fontaine MD, 4:44 PM 03/26/2014

## 2014-04-16 ENCOUNTER — Ambulatory Visit: Payer: 59

## 2014-04-23 ENCOUNTER — Ambulatory Visit: Payer: 59

## 2014-05-06 ENCOUNTER — Encounter (INDEPENDENT_AMBULATORY_CARE_PROVIDER_SITE_OTHER): Payer: Self-pay

## 2014-05-06 ENCOUNTER — Ambulatory Visit
Admission: RE | Admit: 2014-05-06 | Discharge: 2014-05-06 | Disposition: A | Discharge status: Home / Self Care | Payer: 59 | Source: Ambulatory Visit

## 2014-05-06 DIAGNOSIS — Z1231 Encounter for screening mammogram for malignant neoplasm of breast: Secondary | ICD-10-CM

## 2014-10-06 ENCOUNTER — Encounter: Payer: Self-pay | Admitting: Gynecology

## 2015-04-28 ENCOUNTER — Other Ambulatory Visit: Payer: Self-pay

## 2015-04-28 DIAGNOSIS — Z1231 Encounter for screening mammogram for malignant neoplasm of breast: Secondary | ICD-10-CM

## 2015-05-27 ENCOUNTER — Ambulatory Visit: Payer: Self-pay

## 2015-07-01 ENCOUNTER — Ambulatory Visit: Payer: Self-pay

## 2015-08-04 ENCOUNTER — Encounter: Payer: Self-pay | Admitting: Gynecology

## 2015-08-24 ENCOUNTER — Ambulatory Visit
Admission: RE | Admit: 2015-08-24 | Discharge: 2015-08-24 | Disposition: A | Discharge status: Home / Self Care | Payer: 59 | Source: Ambulatory Visit

## 2015-08-24 DIAGNOSIS — Z1231 Encounter for screening mammogram for malignant neoplasm of breast: Secondary | ICD-10-CM

## 2015-08-26 ENCOUNTER — Other Ambulatory Visit: Payer: Self-pay | Admitting: Gynecology

## 2015-08-26 DIAGNOSIS — R928 Other abnormal and inconclusive findings on diagnostic imaging of breast: Secondary | ICD-10-CM

## 2015-08-27 ENCOUNTER — Ambulatory Visit
Admission: RE | Admit: 2015-08-27 | Discharge: 2015-08-27 | Disposition: A | Discharge status: Home / Self Care | Payer: 59 | Source: Ambulatory Visit | Attending: Gynecology | Admitting: Gynecology

## 2015-08-27 DIAGNOSIS — R928 Other abnormal and inconclusive findings on diagnostic imaging of breast: Secondary | ICD-10-CM

## 2015-09-02 ENCOUNTER — Ambulatory Visit (INDEPENDENT_AMBULATORY_CARE_PROVIDER_SITE_OTHER): Payer: 59 | Admitting: Gynecology

## 2015-09-02 ENCOUNTER — Other Ambulatory Visit (HOSPITAL_COMMUNITY)
Admission: RE | Admit: 2015-09-02 | Discharge: 2015-09-02 | Disposition: A | Discharge status: Home / Self Care | Payer: 59 | Source: Ambulatory Visit | Attending: Gynecology | Admitting: Gynecology

## 2015-09-02 ENCOUNTER — Encounter: Payer: Self-pay | Admitting: Gynecology

## 2015-09-02 VITALS — BP 120/74 | Ht 65.0 in | Wt 166.0 lb

## 2015-09-02 DIAGNOSIS — Z01419 Encounter for gynecological examination (general) (routine) without abnormal findings: Secondary | ICD-10-CM | POA: Diagnosis not present

## 2015-09-02 DIAGNOSIS — Z30431 Encounter for routine checking of intrauterine contraceptive device: Secondary | ICD-10-CM

## 2015-09-02 NOTE — Patient Instructions (Signed)

## 2015-09-02 NOTE — Addendum Note (Signed)
Addended by: Dayna Barker on: 09/02/2015 09:06 AM   Modules accepted: Orders

## 2015-09-02 NOTE — Progress Notes (Signed)
Danielle Estrada [birth year] [id number]        54 y.o.  G2P2002 for annual exam.  Doing well without complaints. Several issues noted below.  Past medical history,surgical history, problem list, medications, allergies, family history and social history were all reviewed and documented as reviewed in the EPIC chart.  ROS:  Performed with pertinent positives and negatives included in the history, assessment and plan.   Additional significant findings :  none   Exam: [name] person Vitals:   09/02/15 0804  BP: 120/74  Height:  (1.651 m)  Weight: 166 lb (75.297 kg)   General appearance:  Normal affect, orientation and appearance. Skin: Grossly normal HEENT: Without gross lesions.  No cervical or supraclavicular adenopathy. Thyroid normal.  Lungs:  Clear without wheezing, rales or rhonchi Cardiac: RR, without RMG Abdominal:  Soft, nontender, without masses, guarding, rebound, organomegaly or hernia Breasts:  Examined lying and sitting without masses, retractions, discharge or axillary adenopathy. Pelvic:  Ext/BUS/vagina normal  Cervix normal. IUD string visualized. Pap smear done  Uterus anteverted, normal size, shape and contour, midline and mobile nontender   Adnexa  Without masses or tenderness    Anus and perineum  Normal   Rectovaginal  Normal sphincter tone without palpated masses or tenderness.    Assessment/Plan:  54 y.o. G65P2002 female for annual exam.   1. Mirena IUD 2014. Doing well with one day of spotting each month. No significant hot flashes night sweats or other menopausal symptoms. We'll continue to monitor report any significant symptoms or atypical bleeding.  FSH was 48 in 2014. 2. 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 Reviewed the issues with DES exposure. Will initiate annual Pap smears. No history of abnormal Pap smears previously. Pap/HPV negative last year. Pap smear done today. 3. Mammography 08/2015. Had  call back for shadow which was cleared with follow up views and ultrasound. Continue with annual mammography. SBE monthly reviewed. 4. Colonoscopy never. I again strongly recommended colonoscopy as I have in the past. Patient acknowledges my recommendations. 5. Health maintenance. No routine lab work done as she has this done at her primary physician's office. Follow up 1 year, sooner as needed.   Dara Lords MD, 8:29 AM 09/02/2015

## 2015-09-03 LAB — CYTOLOGY - PAP

## 2016-09-07 ENCOUNTER — Encounter: Payer: 59 | Admitting: Gynecology

## 2016-11-03 ENCOUNTER — Other Ambulatory Visit: Payer: Self-pay | Admitting: Gynecology

## 2016-11-03 DIAGNOSIS — Z1231 Encounter for screening mammogram for malignant neoplasm of breast: Secondary | ICD-10-CM

## 2016-11-04 ENCOUNTER — Encounter: Payer: 59 | Admitting: Gynecology

## 2016-12-06 DIAGNOSIS — G44209 Tension-type headache, unspecified, not intractable: Secondary | ICD-10-CM | POA: Diagnosis not present

## 2016-12-13 ENCOUNTER — Ambulatory Visit
Admission: RE | Admit: 2016-12-13 | Discharge: 2016-12-13 | Disposition: A | Discharge status: Home / Self Care | Payer: 59 | Source: Ambulatory Visit | Attending: Gynecology | Admitting: Gynecology

## 2016-12-13 DIAGNOSIS — Z1231 Encounter for screening mammogram for malignant neoplasm of breast: Secondary | ICD-10-CM

## 2016-12-30 DIAGNOSIS — Z0000 Encounter for general adult medical examination without abnormal findings: Secondary | ICD-10-CM | POA: Diagnosis not present

## 2017-01-19 ENCOUNTER — Ambulatory Visit (INDEPENDENT_AMBULATORY_CARE_PROVIDER_SITE_OTHER): Payer: 59 | Admitting: Gynecology

## 2017-01-19 ENCOUNTER — Encounter: Payer: Self-pay | Admitting: Gynecology

## 2017-01-19 VITALS — BP 120/76 | Ht 66.0 in | Wt 167.0 lb

## 2017-01-19 DIAGNOSIS — Z01411 Encounter for gynecological examination (general) (routine) with abnormal findings: Secondary | ICD-10-CM | POA: Diagnosis not present

## 2017-01-19 DIAGNOSIS — N952 Postmenopausal atrophic vaginitis: Secondary | ICD-10-CM | POA: Diagnosis not present

## 2017-01-19 DIAGNOSIS — Z30431 Encounter for routine checking of intrauterine contraceptive device: Secondary | ICD-10-CM

## 2017-01-19 DIAGNOSIS — Z9189 Other specified personal risk factors, not elsewhere classified: Secondary | ICD-10-CM | POA: Diagnosis not present

## 2017-01-19 NOTE — Addendum Note (Signed)
Addended by: Dayna BarkerGARDNER, Royalti Schauf K on: 01/19/2017 08:51 AM   Modules accepted: Orders

## 2017-01-19 NOTE — Progress Notes (Signed)
    Danielle SchatzSandra L Estrada 07/12/1961 [id number]        56 y.o.  G2P2002 for annual exam.    Past medical history,surgical history, problem list, medications, allergies, family history and social history were all reviewed and documented as reviewed in the EPIC chart.  ROS:  Performed with pertinent positives and negatives included in the history, assessment and plan.   Additional significant findings :  None   Exam: Danielle PortelaKim Estrada assistant Vitals:   01/19/17 0819  BP: 120/76  Weight: 167 lb (75.8 kg)  Height: 5\' 6"  (1.676 m)   Body mass index is 26.95 kg/m.  General appearance:  Normal affect, orientation and appearance. Skin: Grossly normal HEENT: Without gross lesions.  No cervical or supraclavicular adenopathy. Thyroid normal.  Lungs:  Clear without wheezing, rales or rhonchi Cardiac: RR, without RMG Abdominal:  Soft, nontender, without masses, guarding, rebound, organomegaly or hernia Breasts:  Examined lying and sitting without masses, retractions, discharge or axillary adenopathy. Pelvic:  Ext, BUS, Vagina with mild atrophic changes  Cervix with mild atrophic changes. IUD string visualized. Pap smear done  Uterus anteverted, normal size, shape and contour, midline and mobile nontender   Adnexa without masses or tenderness    Anus and perineum normal   Rectovaginal normal sphincter tone without palpated masses or tenderness.    Assessment/Plan:  56 y.o. 242P2002 female for annual exam without menses, Mirena IUD.   1. Mirena IUD 2014. Doing well without bleeding. FSH elevated at 48 2 years ago. Not having significant menopausal symptoms such as hot flushes, night sweats or vaginal dryness. Continue to monitor and plan on removing IUD exterior. 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 We'll continue with annual Pap smears. Pap smear done today. No history of significant abnormal Pap smears previously. 3. Mammography 12/2016. Continue with annual mammography when due. SBE monthly  reviewed. 4. Colonoscopy never. Patient is being followed by her primary physician is doing annual stool guaiacs. She'll continue to follow up with them in reference to colon screening. 5.  DEXA never. Will plan further into the menopause. 6. Health maintenance. No routine lab work done as patient does this elsewhere. Follow up 1 year, sooner as needed.    Dara LordsFONTAINE,Danielle Estrada P MD, 8:41 AM 01/19/2017

## 2017-01-19 NOTE — Patient Instructions (Signed)

## 2017-01-20 LAB — PAP IG W/ RFLX HPV ASCU

## 2017-03-22 DIAGNOSIS — E7800 Pure hypercholesterolemia, unspecified: Secondary | ICD-10-CM | POA: Diagnosis not present

## 2017-03-22 DIAGNOSIS — I10 Essential (primary) hypertension: Secondary | ICD-10-CM | POA: Diagnosis not present

## 2017-03-22 DIAGNOSIS — R7303 Prediabetes: Secondary | ICD-10-CM | POA: Diagnosis not present

## 2017-09-25 DIAGNOSIS — R7303 Prediabetes: Secondary | ICD-10-CM | POA: Diagnosis not present

## 2017-09-25 DIAGNOSIS — I10 Essential (primary) hypertension: Secondary | ICD-10-CM | POA: Diagnosis not present

## 2017-09-25 DIAGNOSIS — E7800 Pure hypercholesterolemia, unspecified: Secondary | ICD-10-CM | POA: Diagnosis not present

## 2017-11-02 DIAGNOSIS — L570 Actinic keratosis: Secondary | ICD-10-CM | POA: Diagnosis not present

## 2017-12-27 DIAGNOSIS — E7800 Pure hypercholesterolemia, unspecified: Secondary | ICD-10-CM | POA: Diagnosis not present

## 2018-01-22 ENCOUNTER — Encounter: Payer: 59 | Admitting: Gynecology

## 2018-02-08 ENCOUNTER — Ambulatory Visit (INDEPENDENT_AMBULATORY_CARE_PROVIDER_SITE_OTHER): Payer: 59 | Admitting: Gynecology

## 2018-02-08 ENCOUNTER — Encounter: Payer: Self-pay | Admitting: Gynecology

## 2018-02-08 VITALS — BP 122/80 | Ht 64.0 in | Wt 164.0 lb

## 2018-02-08 DIAGNOSIS — Z01411 Encounter for gynecological examination (general) (routine) with abnormal findings: Secondary | ICD-10-CM | POA: Diagnosis not present

## 2018-02-08 DIAGNOSIS — N952 Postmenopausal atrophic vaginitis: Secondary | ICD-10-CM

## 2018-02-08 DIAGNOSIS — Z30432 Encounter for removal of intrauterine contraceptive device: Secondary | ICD-10-CM | POA: Diagnosis not present

## 2018-02-08 DIAGNOSIS — Z9189 Other specified personal risk factors, not elsewhere classified: Secondary | ICD-10-CM | POA: Diagnosis not present

## 2018-02-08 NOTE — Addendum Note (Signed)
Addended by: Dayna BarkerGARDNER, Savien Mamula K on: 02/08/2018 09:19 AM   Modules accepted: Orders

## 2018-02-08 NOTE — Patient Instructions (Addendum)
Follow-up in 1 year, sooner if any issues 

## 2018-02-08 NOTE — Progress Notes (Signed)
    Danielle Estrada 11/08/1961 [id number]        57 y.o.  G2P2002 for annual gynecologic exam.  Also wants to have her Mirena IUD removed.  She is at the 5-year mark.  Had elevated FSH in the past, no bleeding and having had some hot flushes and sweats but these seem to have resolved.  Past medical history,surgical history, problem list, medications, allergies, family history and social history were all reviewed and documented as reviewed in the EPIC chart.  ROS:  Performed with pertinent positives and negatives included in the history, assessment and plan.   Additional significant findings : None   Exam: Kennon PortelaKim Gardner assistant Vitals:   02/08/18 0835  BP: 122/80  Weight: 164 lb (74.4 kg)  Height: 5\' 4"  (1.626 m)   Body mass index is 28.15 kg/m.  General appearance:  Normal affect, orientation and appearance. Skin: Grossly normal HEENT: Without gross lesions.  No cervical or supraclavicular adenopathy. Thyroid normal.  Lungs:  Clear without wheezing, rales or rhonchi Cardiac: RR, without RMG Abdominal:  Soft, nontender, without masses, guarding, rebound, organomegaly or hernia Breasts:  Examined lying and sitting without masses, retractions, discharge or axillary adenopathy. Pelvic:  Ext, BUS, Vagina: With mild atrophic changes  Cervix: With mild atrophic changes.  IUD string visualized.  Pap smear done of cervix and 360 degrees of the upper vaginal fornix.  IUD string was grasped with a ring forcep and her Mirena IUD was removed, shown to the patient and discarded.  Uterus: Anteverted, normal size, shape and contour, midline and mobile nontender   Adnexa: Without masses or tenderness    Anus and perineum: Normal   Rectovaginal: Normal sphincter tone without palpated masses or tenderness.    Assessment/Plan:  57 y.o. 422P2002 female for annual gynecologic exam.   1. Mirena IUD 2014.  Removed as above without difficulty at her request.  Options to leave in for longer in the event  of it still is having some menstrual suppressive benefit noting she is not sexually active.  Patient would prefered she will keep a calendar and if to have it removed as we did today.  She will follow and if any bleeding she knows to call. 2. Postmenopausal.  No significant hot flushes, night sweats any bleeding or vaginal dryness. 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  Pap smear done today.  We will continue with annual Pap smears. 4. Mammography scheduled and she will follow-up for this.  Breast exam normal today. 5. Colonoscopy never.  She is doing Cologuard annually at her primary physician's office.  We discussed benefits of colonoscopy over color guard with the false positive/negative issues.  Patient will continue to have this discussion with her primary physician about colon screening. 6. DEXA never.  Will plan further into the menopause. 7. Health maintenance.  No routine lab work done as patient reports this done through her primary physician's office.  Follow-up 1 year, sooner as needed.   Dara Lordsimothy P Galina Haddox MD, 8:58 AM 02/08/2018

## 2018-02-13 LAB — PAP IG W/ RFLX HPV ASCU

## 2018-04-11 DIAGNOSIS — R7303 Prediabetes: Secondary | ICD-10-CM | POA: Diagnosis not present

## 2018-04-11 DIAGNOSIS — E7800 Pure hypercholesterolemia, unspecified: Secondary | ICD-10-CM | POA: Diagnosis not present

## 2018-04-11 DIAGNOSIS — Z23 Encounter for immunization: Secondary | ICD-10-CM | POA: Diagnosis not present

## 2018-04-11 DIAGNOSIS — I10 Essential (primary) hypertension: Secondary | ICD-10-CM | POA: Diagnosis not present

## 2018-04-11 DIAGNOSIS — Z0000 Encounter for general adult medical examination without abnormal findings: Secondary | ICD-10-CM | POA: Diagnosis not present

## 2018-08-28 DIAGNOSIS — Z23 Encounter for immunization: Secondary | ICD-10-CM | POA: Diagnosis not present

## 2018-09-14 DIAGNOSIS — L309 Dermatitis, unspecified: Secondary | ICD-10-CM | POA: Diagnosis not present

## 2018-10-11 DIAGNOSIS — R7303 Prediabetes: Secondary | ICD-10-CM | POA: Diagnosis not present

## 2018-10-11 DIAGNOSIS — I10 Essential (primary) hypertension: Secondary | ICD-10-CM | POA: Diagnosis not present

## 2018-10-11 DIAGNOSIS — E7800 Pure hypercholesterolemia, unspecified: Secondary | ICD-10-CM | POA: Diagnosis not present

## 2018-10-11 DIAGNOSIS — Z1211 Encounter for screening for malignant neoplasm of colon: Secondary | ICD-10-CM | POA: Diagnosis not present

## 2018-10-30 DIAGNOSIS — H10023 Other mucopurulent conjunctivitis, bilateral: Secondary | ICD-10-CM | POA: Diagnosis not present

## 2018-12-10 DIAGNOSIS — H6692 Otitis media, unspecified, left ear: Secondary | ICD-10-CM | POA: Diagnosis not present

## 2019-01-18 ENCOUNTER — Other Ambulatory Visit: Payer: Self-pay | Admitting: Gynecology

## 2019-01-18 DIAGNOSIS — Z1231 Encounter for screening mammogram for malignant neoplasm of breast: Secondary | ICD-10-CM

## 2019-02-13 ENCOUNTER — Other Ambulatory Visit: Payer: Self-pay

## 2019-02-13 ENCOUNTER — Encounter: Payer: Self-pay | Admitting: Gynecology

## 2019-02-13 ENCOUNTER — Ambulatory Visit: Payer: 59

## 2019-02-13 ENCOUNTER — Ambulatory Visit (INDEPENDENT_AMBULATORY_CARE_PROVIDER_SITE_OTHER): Payer: BLUE CROSS/BLUE SHIELD | Admitting: Gynecology

## 2019-02-13 VITALS — BP 120/70 | Ht 65.0 in | Wt 167.0 lb

## 2019-02-13 DIAGNOSIS — Z9189 Other specified personal risk factors, not elsewhere classified: Secondary | ICD-10-CM

## 2019-02-13 DIAGNOSIS — Z01419 Encounter for gynecological examination (general) (routine) without abnormal findings: Secondary | ICD-10-CM

## 2019-02-13 DIAGNOSIS — N952 Postmenopausal atrophic vaginitis: Secondary | ICD-10-CM

## 2019-02-13 NOTE — Addendum Note (Signed)
Addended by: Dayna Barker on: 02/13/2019 09:55 AM   Modules accepted: Orders

## 2019-02-13 NOTE — Progress Notes (Signed)
    Danielle Estrada [birth year] [id number]        58 y.o.  G2P2002 for annual gynecologic exam.  Without gynecologic complaints  Past medical history,surgical history, problem list, medications, allergies, family history and social history were all reviewed and documented as reviewed in the EPIC chart.  ROS:  Performed with pertinent positives and negatives included in the history, assessment and plan.   Additional significant findings : None   Exam: Kennon Portela assistant Vitals:   02/13/19 0911  BP: 120/70  Weight: 167 lb (75.8 kg)  Height: 5\' 5"  (1.651 m)   Body mass index is 27.79 kg/m.  General appearance:  Normal affect, orientation and appearance. Skin: Grossly normal HEENT: Without gross lesions.  No cervical or supraclavicular adenopathy. Thyroid normal.  Lungs:  Clear without wheezing, rales or rhonchi Cardiac: RR, without RMG Abdominal:  Soft, nontender, without masses, guarding, rebound, organomegaly or hernia Breasts:  Examined lying and sitting without masses, retractions, discharge or axillary adenopathy. Pelvic:  Ext, BUS, Vagina: With atrophic changes  Cervix: With atrophic changes.  Pap smear of cervix and upper vaginal fornices 360 degrees performed  Uterus: Anteverted, normal size, shape and contour, midline and mobile nontender   Adnexa: Without masses or tenderness    Anus and perineum: Normal   Rectovaginal: Normal sphincter tone without palpated masses or tenderness.    Assessment/Plan:  58 y.o. G2P2002 female for annual gynecologic exam.   1. Postmenopausal.  No significant menopausal symptoms or any vaginal bleeding. 2. Mammography overdue and I reminded patient to call and schedule and she agrees to do so.  Breast exam normal today. 3. 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  Pap smear done today.  We will continue with annual cytology.  No history of abnormal Pap smears. 4. Colonoscopy never.  She does Cologuard through her primary physician's office but  they also recommended she schedule a baseline colonoscopy and I reinforced this. 5. DEXA never.  Will plan further into the menopause. 6. Health maintenance.  No routine lab work done as patient does this elsewhere.  Follow-up 1 year, sooner as needed.   Dara Lords MD, 9:31 AM 02/13/2019

## 2019-02-13 NOTE — Patient Instructions (Signed)
Follow-up in 1 year for annual exam, sooner if any issues. 

## 2019-02-14 LAB — PAP IG W/ RFLX HPV ASCU

## 2019-02-27 ENCOUNTER — Ambulatory Visit: Payer: 59

## 2019-04-16 DIAGNOSIS — Z0000 Encounter for general adult medical examination without abnormal findings: Secondary | ICD-10-CM | POA: Diagnosis not present

## 2019-05-14 ENCOUNTER — Other Ambulatory Visit: Payer: Self-pay

## 2019-05-14 ENCOUNTER — Ambulatory Visit
Admission: RE | Admit: 2019-05-14 | Discharge: 2019-05-14 | Disposition: A | Discharge status: Home / Self Care | Payer: BC Managed Care – PPO | Source: Ambulatory Visit | Attending: Gynecology | Admitting: Gynecology

## 2019-05-14 DIAGNOSIS — Z1231 Encounter for screening mammogram for malignant neoplasm of breast: Secondary | ICD-10-CM

## 2019-09-03 ENCOUNTER — Encounter: Payer: Self-pay | Admitting: Gynecology

## 2019-10-07 DIAGNOSIS — R7303 Prediabetes: Secondary | ICD-10-CM | POA: Diagnosis not present

## 2019-10-07 DIAGNOSIS — I10 Essential (primary) hypertension: Secondary | ICD-10-CM | POA: Diagnosis not present

## 2019-10-07 DIAGNOSIS — E782 Mixed hyperlipidemia: Secondary | ICD-10-CM | POA: Diagnosis not present

## 2019-11-14 DIAGNOSIS — E782 Mixed hyperlipidemia: Secondary | ICD-10-CM | POA: Diagnosis not present

## 2019-11-14 DIAGNOSIS — F419 Anxiety disorder, unspecified: Secondary | ICD-10-CM | POA: Diagnosis not present

## 2019-11-14 DIAGNOSIS — I10 Essential (primary) hypertension: Secondary | ICD-10-CM | POA: Diagnosis not present

## 2019-11-14 DIAGNOSIS — R7303 Prediabetes: Secondary | ICD-10-CM | POA: Diagnosis not present

## 2020-02-20 DIAGNOSIS — Z23 Encounter for immunization: Secondary | ICD-10-CM | POA: Diagnosis not present

## 2020-03-19 DIAGNOSIS — Z23 Encounter for immunization: Secondary | ICD-10-CM | POA: Diagnosis not present

## 2020-04-21 DIAGNOSIS — Z23 Encounter for immunization: Secondary | ICD-10-CM | POA: Diagnosis not present

## 2020-04-21 DIAGNOSIS — I10 Essential (primary) hypertension: Secondary | ICD-10-CM | POA: Diagnosis not present

## 2020-04-21 DIAGNOSIS — F419 Anxiety disorder, unspecified: Secondary | ICD-10-CM | POA: Diagnosis not present

## 2020-04-21 DIAGNOSIS — Z1159 Encounter for screening for other viral diseases: Secondary | ICD-10-CM | POA: Diagnosis not present

## 2020-04-21 DIAGNOSIS — R7303 Prediabetes: Secondary | ICD-10-CM | POA: Diagnosis not present

## 2020-04-21 DIAGNOSIS — Z0000 Encounter for general adult medical examination without abnormal findings: Secondary | ICD-10-CM | POA: Diagnosis not present

## 2020-04-21 DIAGNOSIS — E782 Mixed hyperlipidemia: Secondary | ICD-10-CM | POA: Diagnosis not present

## 2020-04-22 ENCOUNTER — Other Ambulatory Visit: Payer: Self-pay | Admitting: Obstetrics and Gynecology

## 2020-04-22 DIAGNOSIS — Z1231 Encounter for screening mammogram for malignant neoplasm of breast: Secondary | ICD-10-CM

## 2020-05-06 ENCOUNTER — Encounter: Payer: BLUE CROSS/BLUE SHIELD | Admitting: Nurse Practitioner

## 2020-05-25 ENCOUNTER — Ambulatory Visit: Payer: BC Managed Care – PPO

## 2020-06-30 ENCOUNTER — Other Ambulatory Visit: Payer: Self-pay

## 2020-06-30 ENCOUNTER — Encounter: Payer: Self-pay | Admitting: Nurse Practitioner

## 2020-06-30 ENCOUNTER — Ambulatory Visit (INDEPENDENT_AMBULATORY_CARE_PROVIDER_SITE_OTHER): Payer: BC Managed Care – PPO | Admitting: Nurse Practitioner

## 2020-06-30 VITALS — BP 110/78 | Ht 65.0 in | Wt 156.0 lb

## 2020-06-30 DIAGNOSIS — Z780 Asymptomatic menopausal state: Secondary | ICD-10-CM | POA: Diagnosis not present

## 2020-06-30 DIAGNOSIS — Z01419 Encounter for gynecological examination (general) (routine) without abnormal findings: Secondary | ICD-10-CM | POA: Diagnosis not present

## 2020-06-30 DIAGNOSIS — Z1151 Encounter for screening for human papillomavirus (HPV): Secondary | ICD-10-CM

## 2020-06-30 NOTE — Addendum Note (Signed)
Addended by: Dayna Barker on: 06/30/2020 09:05 AM   Modules accepted: Orders

## 2020-06-30 NOTE — Patient Instructions (Signed)

## 2020-06-30 NOTE — Progress Notes (Signed)
   Danielle Estrada Aug 16, 1961 [id number]   History:  59 y.o. G2P2002 presents for annual exam. Postmenopausal - no HRT, no bleeding. 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 Normal pap and mammogram history.   Gynecologic History Patient's last menstrual period was 12/27/2012.   Contraception: post menopausal status Last Pap: 02/13/2019. Results were: normal Last mammogram: 05/14/2019. Results were: normal Last colonoscopy: never. Cologuard done with primary care Dexa: never  Past medical history, past surgical history, family history and social history were all reviewed and documented in the EPIC chart.  ROS:  A ROS was performed and pertinent positives and negatives are included.  Exam:  Vitals:   06/30/20 0809  BP: 110/78  Weight: 156 lb (70.8 kg)  Height: 5\' 5"  (1.651 m)   Body mass index is 25.96 kg/m.  General appearance:  Normal Thyroid:  Symmetrical, normal in size, without palpable masses or nodularity. Respiratory  Auscultation:  Clear without wheezing or rhonchi Cardiovascular  Auscultation:  Regular rate, without rubs, murmurs or gallops  Edema/varicosities:  Not grossly evident Abdominal  Soft,nontender, without masses, guarding or rebound.  Liver/spleen:  No organomegaly noted  Hernia:  None appreciated  Skin  Inspection:  Grossly normal   Breasts: Examined lying and sitting.   Right: Without masses, retractions, discharge or axillary adenopathy.   Left: Without masses, retractions, discharge or axillary adenopathy. Gentitourinary   Inguinal/mons:  Normal without inguinal adenopathy  External genitalia:  Normal  BUS/Urethra/Skene's glands:  Normal  Vagina:  Normal  Cervix:  Normal, extropion  Uterus:  Anteverted, normal in size, shape and contour.  Midline and mobile  Adnexa/parametria:     Rt: Without masses or tenderness.   Lt: Without masses or tenderness.  Anus and perineum: Normal  Digital rectal exam: Normal sphincter tone without palpated  masses or tenderness  Assessment/Plan:  59 y.o. G for annual exam.   Well female exam with routine gynecological exam - Education provided on SBEs, importance of preventative screenings, current guidelines, high calcium diet, regular exercise, and multivitamin daily. Pap with HR HPV typing today. Labs done with PCP.   Postmenopausal - Plan: DG Bone Density. Taking a daily calcium + vitamin D supplement. Encouraged regular exercise and weightbearing exercises.   Screening for breast cancer - scheduled for mammogram next month  Screening for colon cancer - declines colonoscopy but does Cologuard with PCP. Discussed current guidelines and importance of preventative colonoscopies.  Follow up in 1 year for annual       [address] [facility], 8:33 AM 06/30/2020

## 2020-07-03 LAB — PAP, TP IMAGING W/ HPV RNA, RFLX HPV TYPE 16,18/45: HPV DNA High Risk: NOT DETECTED

## 2020-07-29 ENCOUNTER — Ambulatory Visit
Admission: RE | Admit: 2020-07-29 | Discharge: 2020-07-29 | Disposition: A | Discharge status: Home / Self Care | Payer: BC Managed Care – PPO | Source: Ambulatory Visit | Attending: Obstetrics and Gynecology | Admitting: Obstetrics and Gynecology

## 2020-07-29 ENCOUNTER — Other Ambulatory Visit: Payer: Self-pay

## 2020-07-29 DIAGNOSIS — Z1231 Encounter for screening mammogram for malignant neoplasm of breast: Secondary | ICD-10-CM | POA: Diagnosis not present

## 2020-10-16 DIAGNOSIS — Z20822 Contact with and (suspected) exposure to covid-19: Secondary | ICD-10-CM | POA: Diagnosis not present

## 2020-12-01 DIAGNOSIS — F419 Anxiety disorder, unspecified: Secondary | ICD-10-CM | POA: Diagnosis not present

## 2020-12-01 DIAGNOSIS — R7303 Prediabetes: Secondary | ICD-10-CM | POA: Diagnosis not present

## 2020-12-01 DIAGNOSIS — I10 Essential (primary) hypertension: Secondary | ICD-10-CM | POA: Diagnosis not present

## 2020-12-01 DIAGNOSIS — Z23 Encounter for immunization: Secondary | ICD-10-CM | POA: Diagnosis not present

## 2020-12-01 DIAGNOSIS — E782 Mixed hyperlipidemia: Secondary | ICD-10-CM | POA: Diagnosis not present

## 2021-02-11 DIAGNOSIS — M545 Low back pain, unspecified: Secondary | ICD-10-CM | POA: Diagnosis not present

## 2021-04-29 DIAGNOSIS — I10 Essential (primary) hypertension: Secondary | ICD-10-CM | POA: Diagnosis not present

## 2021-04-29 DIAGNOSIS — Z0000 Encounter for general adult medical examination without abnormal findings: Secondary | ICD-10-CM | POA: Diagnosis not present

## 2021-04-29 DIAGNOSIS — E782 Mixed hyperlipidemia: Secondary | ICD-10-CM | POA: Diagnosis not present

## 2021-04-29 DIAGNOSIS — R7303 Prediabetes: Secondary | ICD-10-CM | POA: Diagnosis not present

## 2021-06-28 ENCOUNTER — Other Ambulatory Visit: Payer: Self-pay | Admitting: Obstetrics and Gynecology

## 2021-06-28 ENCOUNTER — Other Ambulatory Visit: Payer: Self-pay | Admitting: Nurse Practitioner

## 2021-06-28 DIAGNOSIS — Z780 Asymptomatic menopausal state: Secondary | ICD-10-CM

## 2021-06-28 DIAGNOSIS — Z1231 Encounter for screening mammogram for malignant neoplasm of breast: Secondary | ICD-10-CM

## 2021-08-20 ENCOUNTER — Ambulatory Visit: Payer: BC Managed Care – PPO

## 2021-11-05 DIAGNOSIS — I10 Essential (primary) hypertension: Secondary | ICD-10-CM | POA: Diagnosis not present

## 2021-11-05 DIAGNOSIS — R7303 Prediabetes: Secondary | ICD-10-CM | POA: Diagnosis not present

## 2021-11-05 DIAGNOSIS — E782 Mixed hyperlipidemia: Secondary | ICD-10-CM | POA: Diagnosis not present

## 2021-11-05 DIAGNOSIS — F419 Anxiety disorder, unspecified: Secondary | ICD-10-CM | POA: Diagnosis not present

## 2021-11-22 DIAGNOSIS — U071 COVID-19: Secondary | ICD-10-CM | POA: Diagnosis not present

## 2021-12-24 ENCOUNTER — Ambulatory Visit
Admission: RE | Admit: 2021-12-24 | Discharge: 2021-12-24 | Disposition: A | Discharge status: Home / Self Care | Payer: BC Managed Care – PPO | Source: Ambulatory Visit | Attending: Nurse Practitioner | Admitting: Nurse Practitioner

## 2021-12-24 ENCOUNTER — Ambulatory Visit
Admission: RE | Admit: 2021-12-24 | Discharge: 2021-12-24 | Disposition: A | Discharge status: Home / Self Care | Payer: BC Managed Care – PPO | Source: Ambulatory Visit | Attending: Obstetrics and Gynecology | Admitting: Obstetrics and Gynecology

## 2021-12-24 DIAGNOSIS — Z1231 Encounter for screening mammogram for malignant neoplasm of breast: Secondary | ICD-10-CM

## 2021-12-24 DIAGNOSIS — Z780 Asymptomatic menopausal state: Secondary | ICD-10-CM | POA: Diagnosis not present

## 2022-01-17 ENCOUNTER — Ambulatory Visit: Payer: BC Managed Care – PPO | Admitting: Nurse Practitioner

## 2022-01-19 DIAGNOSIS — K219 Gastro-esophageal reflux disease without esophagitis: Secondary | ICD-10-CM | POA: Diagnosis not present

## 2022-01-31 ENCOUNTER — Ambulatory Visit: Payer: BC Managed Care – PPO | Admitting: Nurse Practitioner

## 2022-02-04 ENCOUNTER — Other Ambulatory Visit: Payer: Self-pay

## 2022-02-04 ENCOUNTER — Encounter: Payer: Self-pay | Admitting: Radiology

## 2022-02-04 ENCOUNTER — Ambulatory Visit (INDEPENDENT_AMBULATORY_CARE_PROVIDER_SITE_OTHER): Payer: BC Managed Care – PPO | Admitting: Radiology

## 2022-02-04 ENCOUNTER — Other Ambulatory Visit (HOSPITAL_COMMUNITY)
Admission: RE | Admit: 2022-02-04 | Discharge: 2022-02-04 | Disposition: A | Discharge status: Home / Self Care | Payer: BC Managed Care – PPO | Source: Ambulatory Visit | Attending: Nurse Practitioner | Admitting: Nurse Practitioner

## 2022-02-04 VITALS — BP 130/76 | Ht 63.75 in | Wt 158.0 lb

## 2022-02-04 DIAGNOSIS — Z01419 Encounter for gynecological examination (general) (routine) without abnormal findings: Secondary | ICD-10-CM

## 2022-02-04 NOTE — Progress Notes (Signed)
? ?  Danielle Estrada [birth year] [id number] ? ? ?History: Postmenopausal 61 y.o. [id number] presents for annual exam. Postmenopausal - no HRT, no bleeding. 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 Normal pap and mammogram history.  ? ? ?Gynecologic History ?Postmenopausal ?Last Pap: 2021. Results were: normal ?Last mammogram: 12/24/21. Results were: normal ?Last colonoscopy: schedule 5/23, cologard previously ?HRT use: none ? ?Obstetric History ?OB History  ?Gravida Para Term Preterm AB Living  ?2 2 2     2   ?SAB IAB Ectopic Multiple Live Births  ?        2  ?  ?# Outcome Date GA Lbr Len/2nd Weight Sex Delivery Anes PTL Lv  ?2 Term     M Vag-Spont  N LIV  ?1 Term     M CS-Unspec  N LIV  ? ? ? ?The following portions of the patient's history were reviewed and updated as appropriate: allergies, current medications, past family history, past medical history, past social history, past surgical history, and problem list. ? ?Review of Systems ?Pertinent items noted in HPI and remainder of comprehensive ROS otherwise negative.  ?Past medical history, past surgical history, family history and social history were all reviewed and documented in the EPIC chart. ? ?Exam: ? ?Vitals:  ? 02/04/22 1357  ?BP: 130/76  ?Weight: 158 lb (71.7 kg)  ?Height: 5' 3.75" (1.619 m)  ? ?Body mass index is 27.33 kg/m?. ? ?General appearance:  Normal ?Thyroid:  Symmetrical, normal in size, without palpable masses or nodularity. ?Respiratory ? Auscultation:  Clear without wheezing or rhonchi ?Cardiovascular ? Auscultation:  Regular rate, without rubs, murmurs or gallops ? Edema/varicosities:  Not grossly evident ?Abdominal ? Soft,nontender, without masses, guarding or rebound. ? Liver/spleen:  No organomegaly noted ? Hernia:  None appreciated ? Skin ? Inspection:  Grossly normal ?Breasts: Examined lying and sitting.  ? Right: Without masses, retractions, nipple discharge or axillary adenopathy. ? ? Left: Without masses, retractions, nipple discharge or  axillary adenopathy. ?Genitourinary  ? Inguinal/mons:  Normal without inguinal adenopathy ? External genitalia:  Normal appearing vulva with no masses, tenderness, or lesions ? BUS/Urethra/Skene's glands:  Normal ? Vagina:  Normal appearing with normal color and discharge, no lesions. Atrophy: mild  ? Cervix:  Normal appearing without discharge or lesions ? Uterus:  Normal in size, shape and contour.  Midline and mobile, nontender ? Adnexa/parametria:   ?  Rt: Normal in size, without masses or tenderness. ?  Lt: Normal in size, without masses or tenderness. ? Anus and perineum: Normal ? Digital rectal exam: Normal sphincter tone without palpated masses or tenderness ? ?Patient informed chaperone available to be present for breast and pelvic exam. Patient has requested no chaperone to be present. Patient has been advised what will be completed during breast and pelvic exam.  ? ?Assessment/Plan:   ?1. Well woman exam with routine gynecological exam ? ?- Cytology - PAP( [place])  ? ? ?Discussed SBE, colonoscopy and DEXA screening as directed. Recommend 04/06/22 of exercise weekly, including weight bearing exercise. Encouraged the use of seatbelts and sunscreen.  ?Return in 1 year for annual or sooner prn. ? ? B WHNP-BC, 2:06 PM 02/04/2022  ?

## 2022-02-09 LAB — CYTOLOGY - PAP
Comment: NEGATIVE
Diagnosis: NEGATIVE
High risk HPV: NEGATIVE

## 2022-04-12 DIAGNOSIS — K2100 Gastro-esophageal reflux disease with esophagitis, without bleeding: Secondary | ICD-10-CM | POA: Diagnosis not present

## 2022-04-12 DIAGNOSIS — Z1211 Encounter for screening for malignant neoplasm of colon: Secondary | ICD-10-CM | POA: Diagnosis not present

## 2022-04-12 DIAGNOSIS — D122 Benign neoplasm of ascending colon: Secondary | ICD-10-CM | POA: Diagnosis not present

## 2022-04-12 DIAGNOSIS — K2289 Other specified disease of esophagus: Secondary | ICD-10-CM | POA: Diagnosis not present

## 2022-04-12 DIAGNOSIS — K648 Other hemorrhoids: Secondary | ICD-10-CM | POA: Diagnosis not present

## 2022-04-12 DIAGNOSIS — D125 Benign neoplasm of sigmoid colon: Secondary | ICD-10-CM | POA: Diagnosis not present

## 2022-04-12 DIAGNOSIS — K2930 Chronic superficial gastritis without bleeding: Secondary | ICD-10-CM | POA: Diagnosis not present

## 2022-04-12 DIAGNOSIS — K2970 Gastritis, unspecified, without bleeding: Secondary | ICD-10-CM | POA: Diagnosis not present

## 2022-05-09 DIAGNOSIS — Z0000 Encounter for general adult medical examination without abnormal findings: Secondary | ICD-10-CM | POA: Diagnosis not present

## 2022-05-09 DIAGNOSIS — E782 Mixed hyperlipidemia: Secondary | ICD-10-CM | POA: Diagnosis not present

## 2022-05-09 DIAGNOSIS — I10 Essential (primary) hypertension: Secondary | ICD-10-CM | POA: Diagnosis not present

## 2022-05-09 DIAGNOSIS — R7303 Prediabetes: Secondary | ICD-10-CM | POA: Diagnosis not present

## 2022-11-10 DIAGNOSIS — I10 Essential (primary) hypertension: Secondary | ICD-10-CM | POA: Diagnosis not present

## 2022-11-10 DIAGNOSIS — R7303 Prediabetes: Secondary | ICD-10-CM | POA: Diagnosis not present

## 2022-11-10 DIAGNOSIS — E782 Mixed hyperlipidemia: Secondary | ICD-10-CM | POA: Diagnosis not present

## 2022-11-10 DIAGNOSIS — F5101 Primary insomnia: Secondary | ICD-10-CM | POA: Diagnosis not present

## 2023-02-10 ENCOUNTER — Encounter: Payer: Self-pay | Admitting: Radiology

## 2023-02-10 ENCOUNTER — Ambulatory Visit (INDEPENDENT_AMBULATORY_CARE_PROVIDER_SITE_OTHER): Payer: BC Managed Care – PPO | Admitting: Radiology

## 2023-02-10 ENCOUNTER — Other Ambulatory Visit (HOSPITAL_COMMUNITY)
Admission: RE | Admit: 2023-02-10 | Discharge: 2023-02-10 | Disposition: A | Discharge status: Home / Self Care | Payer: BC Managed Care – PPO | Source: Ambulatory Visit | Attending: Radiology | Admitting: Radiology

## 2023-02-10 VITALS — BP 122/72 | Ht 63.5 in | Wt 162.0 lb

## 2023-02-10 DIAGNOSIS — Z9189 Other specified personal risk factors, not elsewhere classified: Secondary | ICD-10-CM | POA: Diagnosis not present

## 2023-02-10 DIAGNOSIS — Z01419 Encounter for gynecological examination (general) (routine) without abnormal findings: Secondary | ICD-10-CM

## 2023-02-10 NOTE — Progress Notes (Signed)
   Danielle Estrada [birth year] [id number]   History:  62 y.o. G2P2 presents for annual exam. Hx of DES exposure, yearly paps. No gyn concerns.  Gynecologic History Patient's last menstrual period was 12/27/2012.  Sexually active: no Last Pap: 2023. Results were: normal Last mammogram: 2023. Results were: normal Colonoscopy: 2023, polyps  Obstetric History OB History  Gravida Para Term Preterm AB Living  2 2 2     2   SAB IAB Ectopic Multiple Live Births          2    # Outcome Date GA Lbr Len/2nd Weight Sex Delivery Anes PTL Lv  2 Term     M Vag-Spont  N LIV  1 Term     M CS-Unspec  N LIV     The following portions of the patient's history were reviewed and updated as appropriate: allergies, current medications, past family history, past medical history, past social history, past surgical history, and problem list.  Review of Systems Pertinent items noted in HPI and remainder of comprehensive ROS otherwise negative.   Past medical history, past surgical history, family history and social history were all reviewed and documented in the EPIC chart.   Exam:  Vitals:   02/10/23 1327  BP: 122/72  Weight: 162 lb (73.5 kg)  Height: 5' 3.5" (1.613 m)   Body mass index is 28.25 kg/m.  General appearance:  Normal Thyroid:  Symmetrical, normal in size, without palpable masses or nodularity. Respiratory  Auscultation:  Clear without wheezing or rhonchi Cardiovascular  Auscultation:  Regular rate, without rubs, murmurs or gallops  Edema/varicosities:  Not grossly evident Abdominal  Soft,nontender, without masses, guarding or rebound.  Liver/spleen:  No organomegaly noted  Hernia:  None appreciated  Skin  Inspection:  Grossly normal Breasts: Examined lying and sitting.   Right: Without masses, retractions, nipple discharge or axillary adenopathy.   Left: Without masses, retractions, nipple discharge or axillary adenopathy. Genitourinary   Inguinal/mons:  Normal without  inguinal adenopathy  External genitalia:  Normal appearing vulva with no masses, tenderness, or lesions  BUS/Urethra/Skene's glands:  Normal without masses or exudate  Vagina:  Normal appearing with normal color and discharge, no lesions  Cervix:  Normal appearing without discharge or lesions  Uterus:  Normal in size, shape and contour.  Mobile, nontender  Adnexa/parametria:     Rt: Normal in size, without masses or tenderness.   Lt: Normal in size, without masses or tenderness.  Anus and perineum: Normal   Patient informed chaperone available to be present for breast and pelvic exam. Patient has requested no chaperone to be present. Patient has been advised what will be completed during breast and pelvic exam.   Assessment/Plan:   1. Well woman exam with routine gynecological exam - Cytology - PAP( [place])  2. 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 Recommend 157mins of exercise weekly, including weight bearing exercise. Encouraged the use of seatbelts and sunscreen. Return in 1 year for annual or as needed.   Rubbie Battiest B WHNP-BC 1:43 PM 02/10/2023

## 2023-02-13 ENCOUNTER — Other Ambulatory Visit: Payer: Self-pay | Admitting: Nurse Practitioner

## 2023-02-13 DIAGNOSIS — Z1231 Encounter for screening mammogram for malignant neoplasm of breast: Secondary | ICD-10-CM

## 2023-02-15 LAB — CYTOLOGY - PAP
Comment: NEGATIVE
Diagnosis: NEGATIVE
High risk HPV: NEGATIVE

## 2023-03-28 ENCOUNTER — Ambulatory Visit: Payer: BC Managed Care – PPO

## 2023-04-07 DIAGNOSIS — J00 Acute nasopharyngitis [common cold]: Secondary | ICD-10-CM | POA: Diagnosis not present

## 2023-05-04 ENCOUNTER — Ambulatory Visit
Admission: RE | Admit: 2023-05-04 | Discharge: 2023-05-04 | Disposition: A | Discharge status: Home / Self Care | Payer: BC Managed Care – PPO | Source: Ambulatory Visit | Attending: Nurse Practitioner | Admitting: Nurse Practitioner

## 2023-05-04 DIAGNOSIS — Z1231 Encounter for screening mammogram for malignant neoplasm of breast: Secondary | ICD-10-CM

## 2023-05-08 ENCOUNTER — Other Ambulatory Visit: Payer: Self-pay | Admitting: Nurse Practitioner

## 2023-05-08 DIAGNOSIS — R928 Other abnormal and inconclusive findings on diagnostic imaging of breast: Secondary | ICD-10-CM

## 2023-05-12 ENCOUNTER — Ambulatory Visit
Admission: RE | Admit: 2023-05-12 | Discharge: 2023-05-12 | Disposition: A | Discharge status: Home / Self Care | Payer: BC Managed Care – PPO | Source: Ambulatory Visit | Attending: Nurse Practitioner | Admitting: Nurse Practitioner

## 2023-05-12 DIAGNOSIS — R922 Inconclusive mammogram: Secondary | ICD-10-CM | POA: Diagnosis not present

## 2023-05-12 DIAGNOSIS — R928 Other abnormal and inconclusive findings on diagnostic imaging of breast: Secondary | ICD-10-CM

## 2023-05-12 DIAGNOSIS — R921 Mammographic calcification found on diagnostic imaging of breast: Secondary | ICD-10-CM | POA: Diagnosis not present

## 2023-05-26 DIAGNOSIS — I10 Essential (primary) hypertension: Secondary | ICD-10-CM | POA: Diagnosis not present

## 2023-05-26 DIAGNOSIS — Z0000 Encounter for general adult medical examination without abnormal findings: Secondary | ICD-10-CM | POA: Diagnosis not present

## 2023-05-26 DIAGNOSIS — R7303 Prediabetes: Secondary | ICD-10-CM | POA: Diagnosis not present

## 2023-05-26 DIAGNOSIS — Z23 Encounter for immunization: Secondary | ICD-10-CM | POA: Diagnosis not present

## 2023-05-26 DIAGNOSIS — M79671 Pain in right foot: Secondary | ICD-10-CM | POA: Diagnosis not present

## 2023-05-26 DIAGNOSIS — E782 Mixed hyperlipidemia: Secondary | ICD-10-CM | POA: Diagnosis not present

## 2023-06-05 DIAGNOSIS — D225 Melanocytic nevi of trunk: Secondary | ICD-10-CM | POA: Diagnosis not present

## 2023-06-05 DIAGNOSIS — L821 Other seborrheic keratosis: Secondary | ICD-10-CM | POA: Diagnosis not present

## 2023-06-05 DIAGNOSIS — L400 Psoriasis vulgaris: Secondary | ICD-10-CM | POA: Diagnosis not present

## 2023-06-05 DIAGNOSIS — L814 Other melanin hyperpigmentation: Secondary | ICD-10-CM | POA: Diagnosis not present

## 2023-06-06 ENCOUNTER — Ambulatory Visit (INDEPENDENT_AMBULATORY_CARE_PROVIDER_SITE_OTHER): Payer: BC Managed Care – PPO | Admitting: Podiatry

## 2023-06-06 ENCOUNTER — Ambulatory Visit (INDEPENDENT_AMBULATORY_CARE_PROVIDER_SITE_OTHER): Payer: BC Managed Care – PPO

## 2023-06-06 ENCOUNTER — Encounter: Payer: Self-pay | Admitting: Podiatry

## 2023-06-06 DIAGNOSIS — M79672 Pain in left foot: Secondary | ICD-10-CM | POA: Diagnosis not present

## 2023-06-06 DIAGNOSIS — M7751 Other enthesopathy of right foot: Secondary | ICD-10-CM

## 2023-06-06 DIAGNOSIS — M7752 Other enthesopathy of left foot: Secondary | ICD-10-CM

## 2023-06-06 DIAGNOSIS — M205X9 Other deformities of toe(s) (acquired), unspecified foot: Secondary | ICD-10-CM | POA: Diagnosis not present

## 2023-06-06 DIAGNOSIS — M79671 Pain in right foot: Secondary | ICD-10-CM | POA: Diagnosis not present

## 2023-06-06 MED ORDER — TRIAMCINOLONE ACETONIDE 10 MG/ML IJ SUSP
20.0000 mg | Freq: Once | INTRAMUSCULAR | Status: AC
  Administered 2023-06-06: 20 mg

## 2023-06-06 NOTE — Progress Notes (Signed)
Subjective:   Patient ID: Danielle Estrada, female   DOB: 62 y.o.   MRN: [id number]   HPI Patient presents stating that she has had chronic problems with her big toe joints of both feet for a number of years and she had surgery on the left one to remove spurs that was not successful and that the joint still remains sore along with the right 1 which has been very tender and sore.  Patient is also getting pain on top of her foot and a unusual discomfort in her lesser metatarsal bones that is hard for her to express.  Patient does not smoke likes to be active   Review of Systems  All other systems reviewed and are negative.       Objective:  Physical Exam Vitals and nursing note reviewed.  Constitutional:      Appearance: She is well-developed.  Pulmonary:     Effort: Pulmonary effort is normal.  Musculoskeletal:        General: Normal range of motion.  Skin:    General: Skin is warm.  Neurological:     Mental Status: She is alert.     Neurovascular status intact muscle strength found to be adequate range of motion subtalar midtarsal joint intact.  Significant loss of motion first MPJ bilateral with large spur formation around the first MPJ right with moderate crepitus of the joint bilateral.  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  I did sterile prep I did periarticular injection around the first MPJ bilateral 3  mg Kenalog 5 mg Xylocaine and I advised on rigid bottom shoes.  I want to check back again in 6 weeks I do think it will require either fusion or joint implantation procedures for the long-term success of the problems that she happens  X-rays indicate severe arthritis first MPJ bilateral with spur formation first metatarsal head right foot

## 2023-07-12 ENCOUNTER — Ambulatory Visit: Payer: BC Managed Care – PPO | Admitting: Podiatry

## 2023-08-30 DIAGNOSIS — M79642 Pain in left hand: Secondary | ICD-10-CM | POA: Diagnosis not present

## 2023-08-30 DIAGNOSIS — M79641 Pain in right hand: Secondary | ICD-10-CM | POA: Diagnosis not present

## 2023-09-18 DIAGNOSIS — M79641 Pain in right hand: Secondary | ICD-10-CM | POA: Diagnosis not present

## 2023-09-18 DIAGNOSIS — M20012 Mallet finger of left finger(s): Secondary | ICD-10-CM | POA: Diagnosis not present

## 2023-09-18 DIAGNOSIS — G5602 Carpal tunnel syndrome, left upper limb: Secondary | ICD-10-CM | POA: Diagnosis not present

## 2023-09-26 DIAGNOSIS — M79642 Pain in left hand: Secondary | ICD-10-CM | POA: Diagnosis not present

## 2023-10-30 DIAGNOSIS — M79642 Pain in left hand: Secondary | ICD-10-CM | POA: Diagnosis not present

## 2023-11-14 ENCOUNTER — Other Ambulatory Visit: Payer: Self-pay | Admitting: Family Medicine

## 2023-11-14 DIAGNOSIS — R921 Mammographic calcification found on diagnostic imaging of breast: Secondary | ICD-10-CM

## 2023-12-14 ENCOUNTER — Ambulatory Visit
Admission: RE | Admit: 2023-12-14 | Discharge: 2023-12-14 | Disposition: A | Discharge status: Home / Self Care | Payer: BC Managed Care – PPO | Source: Ambulatory Visit | Attending: Family Medicine | Admitting: Family Medicine

## 2023-12-14 DIAGNOSIS — R921 Mammographic calcification found on diagnostic imaging of breast: Secondary | ICD-10-CM

## 2023-12-14 DIAGNOSIS — G5602 Carpal tunnel syndrome, left upper limb: Secondary | ICD-10-CM | POA: Diagnosis not present

## 2023-12-14 DIAGNOSIS — M20012 Mallet finger of left finger(s): Secondary | ICD-10-CM | POA: Diagnosis not present

## 2023-12-14 DIAGNOSIS — M79642 Pain in left hand: Secondary | ICD-10-CM | POA: Diagnosis not present

## 2023-12-14 DIAGNOSIS — M79641 Pain in right hand: Secondary | ICD-10-CM | POA: Diagnosis not present

## 2023-12-19 DIAGNOSIS — R7303 Prediabetes: Secondary | ICD-10-CM | POA: Diagnosis not present

## 2023-12-19 DIAGNOSIS — F5101 Primary insomnia: Secondary | ICD-10-CM | POA: Diagnosis not present

## 2023-12-19 DIAGNOSIS — E782 Mixed hyperlipidemia: Secondary | ICD-10-CM | POA: Diagnosis not present

## 2023-12-19 DIAGNOSIS — I10 Essential (primary) hypertension: Secondary | ICD-10-CM | POA: Diagnosis not present

## 2024-02-07 DIAGNOSIS — G5602 Carpal tunnel syndrome, left upper limb: Secondary | ICD-10-CM | POA: Diagnosis not present

## 2024-02-15 DIAGNOSIS — S93601A Unspecified sprain of right foot, initial encounter: Secondary | ICD-10-CM | POA: Diagnosis not present

## 2024-02-15 DIAGNOSIS — S60212A Contusion of left wrist, initial encounter: Secondary | ICD-10-CM | POA: Diagnosis not present

## 2024-02-15 DIAGNOSIS — S80212A Abrasion, left knee, initial encounter: Secondary | ICD-10-CM | POA: Diagnosis not present

## 2024-02-15 DIAGNOSIS — M79671 Pain in right foot: Secondary | ICD-10-CM | POA: Diagnosis not present

## 2024-02-21 DIAGNOSIS — M25632 Stiffness of left wrist, not elsewhere classified: Secondary | ICD-10-CM | POA: Diagnosis not present

## 2024-05-20 ENCOUNTER — Other Ambulatory Visit: Payer: Self-pay | Admitting: Family Medicine

## 2024-05-20 DIAGNOSIS — R921 Mammographic calcification found on diagnostic imaging of breast: Secondary | ICD-10-CM

## 2024-05-20 DIAGNOSIS — Z09 Encounter for follow-up examination after completed treatment for conditions other than malignant neoplasm: Secondary | ICD-10-CM

## 2024-06-18 ENCOUNTER — Ambulatory Visit
Admission: RE | Admit: 2024-06-18 | Discharge: 2024-06-18 | Disposition: A | Discharge status: Home / Self Care | Source: Ambulatory Visit | Attending: Family Medicine | Admitting: Family Medicine

## 2024-06-18 ENCOUNTER — Other Ambulatory Visit (HOSPITAL_COMMUNITY)
Admission: RE | Admit: 2024-06-18 | Discharge: 2024-06-18 | Disposition: A | Discharge status: Home / Self Care | Source: Ambulatory Visit | Attending: Radiology | Admitting: Radiology

## 2024-06-18 ENCOUNTER — Encounter: Payer: Self-pay | Admitting: Radiology

## 2024-06-18 ENCOUNTER — Ambulatory Visit (INDEPENDENT_AMBULATORY_CARE_PROVIDER_SITE_OTHER): Admitting: Radiology

## 2024-06-18 VITALS — BP 128/70 | HR 72 | Ht 64.17 in | Wt 157.2 lb

## 2024-06-18 DIAGNOSIS — Z01419 Encounter for gynecological examination (general) (routine) without abnormal findings: Secondary | ICD-10-CM | POA: Diagnosis not present

## 2024-06-18 DIAGNOSIS — R921 Mammographic calcification found on diagnostic imaging of breast: Secondary | ICD-10-CM | POA: Diagnosis not present

## 2024-06-18 DIAGNOSIS — Z1331 Encounter for screening for depression: Secondary | ICD-10-CM

## 2024-06-18 DIAGNOSIS — Z9189 Other specified personal risk factors, not elsewhere classified: Secondary | ICD-10-CM | POA: Diagnosis not present

## 2024-06-18 DIAGNOSIS — Z09 Encounter for follow-up examination after completed treatment for conditions other than malignant neoplasm: Secondary | ICD-10-CM

## 2024-06-18 NOTE — Patient Instructions (Signed)
 Preventive Care 16-62 Years Old, Female  Preventive care refers to lifestyle choices and visits with your health care provider that can promote health and wellness. Preventive care visits are also called wellness exams.  What can I expect for my preventive care visit?  Counseling  Your health care provider may ask you questions about your:  Medical history, including:  Past medical problems.  Family medical history.  Pregnancy history.  Current health, including:  Menstrual cycle.  Method of birth control.  Emotional well-being.  Home life and relationship well-being.  Sexual activity and sexual health.  Lifestyle, including:  Alcohol, nicotine or tobacco, and drug use.  Access to firearms.  Diet, exercise, and sleep habits.  Work and work Astronomer.  Sunscreen use.  Safety issues such as seatbelt and bike helmet use.  Physical exam  Your health care provider will check your:  Height and weight. These may be used to calculate your BMI (body mass index). BMI is a measurement that tells if you are at a healthy weight.  Waist circumference. This measures the distance around your waistline. This measurement also tells if you are at a healthy weight and may help predict your risk of certain diseases, such as type 2 diabetes and high blood pressure.  Heart rate and blood pressure.  Body temperature.  Skin for abnormal spots.  What immunizations do I need?    Vaccines are usually given at various ages, according to a schedule. Your health care provider will recommend vaccines for you based on your age, medical history, and lifestyle or other factors, such as travel or where you work.  What tests do I need?  Screening  Your health care provider may recommend screening tests for certain conditions. This may include:  Lipid and cholesterol levels.  Diabetes screening. This is done by checking your blood sugar (glucose) after you have not eaten for a while (fasting).  Pelvic exam and Pap test.  Hepatitis B test.  Hepatitis C  test.  HIV (human immunodeficiency virus) test.  STI (sexually transmitted infection) testing, if you are at risk.  Lung cancer screening.  Colorectal cancer screening.  Mammogram. Talk with your health care provider about when you should start having regular mammograms. This may depend on whether you have a family history of breast cancer.  BRCA-related cancer screening. This may be done if you have a family history of breast, ovarian, tubal, or peritoneal cancers.  Bone density scan. This is done to screen for osteoporosis.  Talk with your health care provider about your test results, treatment options, and if necessary, the need for more tests.  Follow these instructions at home:  Eating and drinking    Eat a diet that includes fresh fruits and vegetables, whole grains, lean protein, and low-fat dairy products.  Take vitamin and mineral supplements as recommended by your health care provider.  Do not drink alcohol if:  Your health care provider tells you not to drink.  You are pregnant, may be pregnant, or are planning to become pregnant.  If you drink alcohol:  Limit how much you have to 0-1 drink a day.  Know how much alcohol is in your drink. In the [place] one drink equals one 12 oz bottle of beer (355 mL), one 5 oz glass of wine (148 mL), or one 1 oz glass of hard liquor (44 mL).  Lifestyle  Brush your teeth every morning and night with fluoride toothpaste. Floss one time each day.  Exercise for at least  30 minutes 5 or more days each week.  Do not use any products that contain nicotine or tobacco. These products include cigarettes, chewing tobacco, and vaping devices, such as e-cigarettes. If you need help quitting, ask your health care provider.  Do not use drugs.  If you are sexually active, practice safe sex. Use a condom or other form of protection to prevent STIs.  If you do not wish to become pregnant, use a form of birth control. If you plan to become pregnant, see your health care provider for a  prepregnancy visit.  Take aspirin only as told by your health care provider. Make sure that you understand how much to take and what form to take. Work with your health care provider to find out whether it is safe and beneficial for you to take aspirin daily.  Find healthy ways to manage stress, such as:  Meditation, yoga, or listening to music.  Journaling.  Talking to a trusted person.  Spending time with friends and family.  Minimize exposure to UV radiation to reduce your risk of skin cancer.  Safety  Always wear your seat belt while driving or riding in a vehicle.  Do not drive:  If you have been drinking alcohol. Do not ride with someone who has been drinking.  When you are tired or distracted.  While texting.  If you have been using any mind-altering substances or drugs.  Wear a helmet and other protective equipment during sports activities.  If you have firearms in your house, make sure you follow all gun safety procedures.  Seek help if you have been physically or sexually abused.  What's next?  Visit your health care provider once a year for an annual wellness visit.  Ask your health care provider how often you should have your eyes and teeth checked.  Stay up to date on all vaccines.  This information is not intended to replace advice given to you by your health care provider. Make sure you discuss any questions you have with your health care provider.  Document Revised: 05/19/2021 Document Reviewed: 05/19/2021  Elsevier Patient Education  2024 [employer].

## 2024-06-18 NOTE — Progress Notes (Signed)
   Danielle Estrada 13-Dec-1960 [id number]   History:  63 y.o. G2P2 presents for annual exam. Hx of DES exposure, yearly paps. No gyn concerns.  Gynecologic History Patient's last menstrual period was 12/27/2012.  Sexually active: no Last Pap: 2024. Results were: normal Last mammogram: 7/25. Results were: normal Colonoscopy: 2023, polyps DEXA: 2023 normal  Obstetric History OB History  Gravida Para Term Preterm AB Living  2 2 2   2   SAB IAB Ectopic Multiple Live Births      2    # Outcome Date GA Lbr Len/2nd Weight Sex Type Anes PTL Lv  2 Term     M Vag-Spont  N LIV  1 Term     M CS-Unspec  N LIV      06/18/2024    3:43 PM  Depression screen PHQ 2/9  Decreased Interest 0  Down, Depressed, Hopeless 0  PHQ - 2 Score 0     The following portions of the patient's history were reviewed and updated as appropriate: allergies, current medications, past family history, past medical history, past social history, past surgical history, and problem list.  Review of Systems Pertinent items noted in HPI and remainder of comprehensive ROS otherwise negative.   Past medical history, past surgical history, family history and social history were all reviewed and documented in the EPIC chart.   Exam:  Vitals:   06/18/24 1543  BP: 128/70  Pulse: 72  SpO2: 97%  Weight: 157 lb 3.2 oz (71.3 kg)  Height: 5' 4.17 (1.63 m)   Body mass index is 26.84 kg/m.  General appearance:  Normal Thyroid:  Symmetrical, normal in size, without palpable masses or nodularity. Respiratory  Auscultation:  Clear without wheezing or rhonchi Cardiovascular  Auscultation:  Regular rate, without rubs, murmurs or gallops  Edema/varicosities:  Not grossly evident Abdominal  Soft,nontender, without masses, guarding or rebound.  Liver/spleen:  No organomegaly noted  Hernia:  None appreciated  Skin  Inspection:  Grossly normal Breasts: Examined lying and sitting.   Right: Without masses,  retractions, nipple discharge or axillary adenopathy.   Left: Without masses, retractions, nipple discharge or axillary adenopathy. Genitourinary   Inguinal/mons:  Normal without inguinal adenopathy  External genitalia:  Normal appearing vulva with no masses, tenderness, or lesions  BUS/Urethra/Skene's glands:  Normal without masses or exudate  Vagina:  Normal appearing with normal color and discharge, no lesions  Cervix:  Normal appearing without discharge or lesions  Uterus:  Normal in size, shape and contour.  Mobile, nontender  Adnexa/parametria:     Rt: Normal in size, without masses or tenderness.   Lt: Normal in size, without masses or tenderness.  Anus and perineum: Normal   Darice Hoit, CMA present for exam  Assessment/Plan:    1. Well woman exam with routine gynecological exam (Primary) - Cytology - PAP( [place])  2. DES exposure in utero - Cytology - PAP( [place])  3. Depression screen    Discussed SBE, colonoscopy and DEXA screening as directed/appropriate. Recommend of exercise weekly, including weight bearing exercise. Encouraged the use of seatbelts and sunscreen. Return in 1 year for annual or as needed.   GINETTE COZIER B WHNP-BC 3:51 PM 06/18/2024

## 2024-06-24 LAB — CYTOLOGY - PAP
Comment: NEGATIVE
Diagnosis: NEGATIVE
High risk HPV: NEGATIVE

## 2024-06-25 ENCOUNTER — Ambulatory Visit: Payer: Self-pay | Admitting: Radiology

## 2024-07-11 DIAGNOSIS — E782 Mixed hyperlipidemia: Secondary | ICD-10-CM | POA: Diagnosis not present

## 2024-07-11 DIAGNOSIS — Z0000 Encounter for general adult medical examination without abnormal findings: Secondary | ICD-10-CM | POA: Diagnosis not present

## 2024-07-11 DIAGNOSIS — I10 Essential (primary) hypertension: Secondary | ICD-10-CM | POA: Diagnosis not present

## 2024-07-11 DIAGNOSIS — R7303 Prediabetes: Secondary | ICD-10-CM | POA: Diagnosis not present

## 2024-09-06 ENCOUNTER — Other Ambulatory Visit (HOSPITAL_COMMUNITY): Payer: Self-pay | Admitting: Family Medicine

## 2024-09-06 DIAGNOSIS — Z136 Encounter for screening for cardiovascular disorders: Secondary | ICD-10-CM

## 2024-09-17 ENCOUNTER — Ambulatory Visit (HOSPITAL_COMMUNITY)
Admission: RE | Admit: 2024-09-17 | Discharge: 2024-09-17 | Disposition: A | Discharge status: Home / Self Care | Payer: Self-pay | Source: Ambulatory Visit | Attending: Internal Medicine | Admitting: Internal Medicine

## 2024-09-17 DIAGNOSIS — I2510 Atherosclerotic heart disease of native coronary artery without angina pectoris: Secondary | ICD-10-CM | POA: Diagnosis not present

## 2024-09-17 DIAGNOSIS — Z136 Encounter for screening for cardiovascular disorders: Secondary | ICD-10-CM | POA: Insufficient documentation

## 2024-09-17 DIAGNOSIS — I700 Atherosclerosis of aorta: Secondary | ICD-10-CM | POA: Diagnosis not present
